# Patient Record
Sex: Male | Born: 1970 | Race: Black or African American | Hispanic: No | Smoking: Current every day smoker
Health system: Southern US, Community
[De-identification: ages and names within clinical notes are randomized; demographics above are authoritative.]

## PROBLEM LIST (undated history)

## (undated) DIAGNOSIS — I10 Essential (primary) hypertension: Secondary | ICD-10-CM

## (undated) DIAGNOSIS — K859 Acute pancreatitis without necrosis or infection, unspecified: Secondary | ICD-10-CM

## (undated) HISTORY — PX: LEG SURGERY: SHX1003

## (undated) HISTORY — PX: OTHER SURGICAL HISTORY: SHX169

---

## 2014-04-30 ENCOUNTER — Ambulatory Visit: Payer: Self-pay

## 2014-04-30 LAB — BASIC METABOLIC PANEL
Anion Gap: 7 (ref 7–16)
BUN: 12 mg/dL (ref 7–18)
Calcium, Total: 8.4 mg/dL — ABNORMAL LOW (ref 8.5–10.1)
Chloride: 106 mmol/L (ref 98–107)
Co2: 27 mmol/L (ref 21–32)
Creatinine: 0.95 mg/dL (ref 0.60–1.30)
EGFR (Non-African Amer.): >60
Glucose: 88 mg/dL (ref 65–99)
Osmolality: 279 (ref 275–301)
POTASSIUM: 3.8 mmol/L (ref 3.5–5.1)
SODIUM: 140 mmol/L (ref 136–145)

## 2014-06-17 ENCOUNTER — Encounter (HOSPITAL_COMMUNITY): Payer: Self-pay

## 2014-06-17 ENCOUNTER — Emergency Department (HOSPITAL_COMMUNITY)
Admission: EM | Admit: 2014-06-17 | Discharge: 2014-06-17 | Disposition: A | Discharge status: Home / Self Care | Payer: Medicaid Other | Attending: Emergency Medicine | Admitting: Emergency Medicine

## 2014-06-17 DIAGNOSIS — K852 Alcohol induced acute pancreatitis without necrosis or infection: Secondary | ICD-10-CM

## 2014-06-17 DIAGNOSIS — F1410 Cocaine abuse, uncomplicated: Secondary | ICD-10-CM | POA: Diagnosis not present

## 2014-06-17 DIAGNOSIS — Z720 Tobacco use: Secondary | ICD-10-CM | POA: Insufficient documentation

## 2014-06-17 DIAGNOSIS — I10 Essential (primary) hypertension: Secondary | ICD-10-CM | POA: Diagnosis not present

## 2014-06-17 DIAGNOSIS — F1210 Cannabis abuse, uncomplicated: Secondary | ICD-10-CM | POA: Diagnosis not present

## 2014-06-17 DIAGNOSIS — R109 Unspecified abdominal pain: Secondary | ICD-10-CM | POA: Diagnosis present

## 2014-06-17 HISTORY — DX: Essential (primary) hypertension: I10

## 2014-06-17 LAB — CBC WITH DIFFERENTIAL/PLATELET
Basophils Absolute: 0 10*3/uL (ref 0.0–0.1)
Basophils Relative: 0 % (ref 0–1)
Eosinophils Absolute: 0.1 10*3/uL (ref 0.0–0.7)
Eosinophils Relative: 1 % (ref 0–5)
HCT: 49.3 % (ref 39.0–52.0)
HEMOGLOBIN: 16.4 g/dL (ref 13.0–17.0)
LYMPHS ABS: 1.2 10*3/uL (ref 0.7–4.0)
LYMPHS PCT: 14 % (ref 12–46)
MCH: 27.6 pg (ref 26.0–34.0)
MCHC: 33.3 g/dL (ref 30.0–36.0)
MCV: 82.9 fL (ref 78.0–100.0)
MONO ABS: 1.1 10*3/uL — AB (ref 0.1–1.0)
Monocytes Relative: 13 % — ABNORMAL HIGH (ref 3–12)
NEUTROS ABS: 6.1 10*3/uL (ref 1.7–7.7)
NEUTROS PCT: 72 % (ref 43–77)
Platelets: 284 10*3/uL (ref 150–400)
RBC: 5.95 MIL/uL — ABNORMAL HIGH (ref 4.22–5.81)
RDW: 14.6 % (ref 11.5–15.5)
WBC: 8.5 10*3/uL (ref 4.0–10.5)

## 2014-06-17 LAB — RAPID URINE DRUG SCREEN, HOSP PERFORMED
Amphetamines: NOT DETECTED
BARBITURATES: NOT DETECTED
Benzodiazepines: NOT DETECTED
COCAINE: POSITIVE — AB
Opiates: NOT DETECTED
TETRAHYDROCANNABINOL: POSITIVE — AB

## 2014-06-17 LAB — HEPATIC FUNCTION PANEL
ALK PHOS: 77 U/L (ref 39–117)
ALT: 48 U/L (ref 0–53)
AST: 54 U/L — ABNORMAL HIGH (ref 0–37)
Albumin: 4.5 g/dL (ref 3.5–5.2)
BILIRUBIN DIRECT: 0.3 mg/dL (ref 0.0–0.5)
BILIRUBIN INDIRECT: 0.6 mg/dL (ref 0.3–0.9)
Total Bilirubin: 0.9 mg/dL (ref 0.3–1.2)
Total Protein: 7.9 g/dL (ref 6.0–8.3)

## 2014-06-17 LAB — BASIC METABOLIC PANEL
Anion gap: 9 (ref 5–15)
BUN: 8 mg/dL (ref 6–23)
CALCIUM: 9.5 mg/dL (ref 8.4–10.5)
CO2: 24 mmol/L (ref 19–32)
Chloride: 102 mmol/L (ref 96–112)
Creatinine, Ser: 0.83 mg/dL (ref 0.50–1.35)
GFR calc Af Amer: >90 mL/min (ref >90)
Glucose, Bld: 89 mg/dL (ref 70–99)
POTASSIUM: 3.6 mmol/L (ref 3.5–5.1)
Sodium: 135 mmol/L (ref 135–145)

## 2014-06-17 LAB — URINALYSIS, ROUTINE W REFLEX MICROSCOPIC
Bilirubin Urine: NEGATIVE
Glucose, UA: NEGATIVE mg/dL
Hgb urine dipstick: NEGATIVE
Ketones, ur: 40 mg/dL — AB
Leukocytes, UA: NEGATIVE
Nitrite: NEGATIVE
PROTEIN: NEGATIVE mg/dL
UROBILINOGEN UA: 0.2 mg/dL (ref 0.0–1.0)
pH: 5.5 (ref 5.0–8.0)

## 2014-06-17 LAB — ETHANOL: ALCOHOL ETHYL (B): 6 mg/dL (ref 0–9)

## 2014-06-17 LAB — LIPASE, BLOOD: LIPASE: 189 U/L — AB (ref 11–59)

## 2014-06-17 MED ORDER — SODIUM CHLORIDE 0.9 % IV BOLUS (SEPSIS)
1000.0000 mL | Freq: Once | INTRAVENOUS | Status: AC
  Administered 2014-06-17: 1000 mL via INTRAVENOUS

## 2014-06-17 MED ORDER — ONDANSETRON 8 MG PO TBDP: 8.0000 mg | ORAL_TABLET | Freq: Three times a day (TID) | ORAL | Status: DC | PRN

## 2014-06-17 MED ORDER — ONDANSETRON HCL 4 MG/2ML IJ SOLN
4.0000 mg | Freq: Once | INTRAMUSCULAR | Status: AC
  Administered 2014-06-17: 4 mg via INTRAVENOUS
  Filled 2014-06-17: qty 2

## 2014-06-17 MED ORDER — HYDROCODONE-ACETAMINOPHEN 5-325 MG PO TABS: 1.0000 | ORAL_TABLET | ORAL | Status: DC | PRN

## 2014-06-17 MED ORDER — FENTANYL CITRATE 0.05 MG/ML IJ SOLN
100.0000 ug | INTRAMUSCULAR | Status: DC | PRN
  Administered 2014-06-17: 100 ug via INTRAVENOUS
  Filled 2014-06-17: qty 2

## 2014-06-17 NOTE — ED Provider Notes (Signed)
CSN: [id number]     Arrival date & time 06/17/14  1025 History  This chart was scribe for Wesley Melter, MD by Wesley Sexton, ED Scribe. The patient was seen in room APA03/APA03 and the patient's care was started at 12:24 PM.     Chief Complaint  Patient presents with  . Emesis  . Abdominal Pain   The history is provided by the patient. No language interpreter was used.   HPI Comments: Wesley Sexton is a 44 y.o. male with a hx of HTN who presents to the Emergency Department complaining of an 8/10 constant gradually worsening abdominal pain onset yesterday. He reports associated N/V. He denies diarrhea. He denies eating this morning. He reports a similar past hx about 4-5 years ago. He states that he smokes blunts and cigarettes and drinks about 2 40 s a day. He adds that he put crack on his blunts last night. He states that he is currently on Lisinopril for his HTN.   Dr. Alexia Sexton (Health Department)  Past Medical History  Diagnosis Date  . Hypertension    Past Surgical History  Procedure Laterality Date  . "pin hole" in heart    . Leg surgery     No family history on file. History  Substance Use Topics  . Smoking status: Current Every Day Smoker  . Smokeless tobacco: Not on file  . Alcohol Use: Yes     Comment: occ- last drank 2 40oz beers last night.    Review of Systems  Gastrointestinal: Positive for nausea, vomiting and abdominal pain. Negative for diarrhea.  All other systems reviewed and are negative.     Allergies  Review of patient's allergies indicates no known allergies.  Home Medications   Prior to Admission medications   Medication Sig Start Date End Date Taking? Authorizing Provider  HYDROcodone-acetaminophen (NORCO) 5-325 MG per tablet Take 1 tablet by mouth every 4 (four) hours as needed. 06/17/14   Wesley Melter, MD  ondansetron (ZOFRAN ODT) 8 MG disintegrating tablet Take 1 tablet (8 mg total) by mouth every 8 (eight) hours as needed for nausea or  vomiting. 06/17/14   Wesley Melter, MD   BP 147/72 mmHg  Pulse 71  Temp(Src) 98 F (36.7 C) (Oral)  Resp 16  Ht  (1.727 m)  Wt 206 lb (93.441 kg)  BMI 31.33 kg/m2  SpO2 96% Physical Exam  Constitutional: He is oriented to person, place, and time. He appears well-developed and well-nourished.  HENT:  Head: Normocephalic and atraumatic.  Right Ear: External ear normal.  Left Ear: External ear normal.  Eyes: Conjunctivae and EOM are normal. Pupils are equal, round, and reactive to light.  Neck: Normal range of motion and phonation normal. Neck supple.  Cardiovascular: Normal rate, regular rhythm and normal heart sounds.   Pulmonary/Chest: Effort normal and breath sounds normal. He exhibits no bony tenderness.  Abdominal: Soft. There is no tenderness.  Genitourinary:  No CVA tenderness  Musculoskeletal: Normal range of motion.  Neurological: He is alert and oriented to person, place, and time. No cranial nerve deficit or sensory deficit. He exhibits normal muscle tone. Coordination normal.  Skin: Skin is warm, dry and intact.  Psychiatric: He has a normal mood and affect. His behavior is normal. Judgment and thought content normal.  Nursing note and vitals reviewed.   ED Course  Procedures (including critical care time) DIAGNOSTIC STUDIES: Oxygen Saturation is 99% on RA, normal by my interpretation.    COORDINATION  OF CARE: Medications  fentaNYL (SUBLIMAZE) injection 100 mcg (100 mcg Intravenous Given 06/17/14 1243)  ondansetron (ZOFRAN) injection 4 mg (4 mg Intravenous Given 06/17/14 1243)  sodium chloride 0.9 % bolus 1,000 mL (0 mLs Intravenous Stopped 06/17/14 1358)    Patient Vitals for the past 24 hrs:  BP Temp Temp src Pulse Resp SpO2 Height Weight  06/17/14 1300 147/72 mmHg - - 71 16 96 % - -  06/17/14 1200 146/99 mmHg - - 64 - 99 % - -  06/17/14 1026 (!) 163/104 mmHg 98 F (36.7 C) Oral 63 18 96 % 5\' 8"  (1.727 m) 206 lb (93.441 kg)    12:30 PM- Pt advised of plan  for treatment and pt agrees.   2:12 PM Reevaluation with update and discussion. After initial assessment and treatment, an updated evaluation reveals he is feeling better now, and would like to try drinking some fluids. Findings discussed with patient and family members.Wesley Sexton. Wesley Sexton      Labs Review Labs Reviewed  CBC WITH DIFFERENTIAL/PLATELET - Abnormal; Notable for the following:    RBC 5.95 (*)    Monocytes Relative 13 (*)    Monocytes Absolute 1.1 (*)    All other components within normal limits  URINALYSIS, ROUTINE W REFLEX MICROSCOPIC - Abnormal; Notable for the following:    Specific Gravity, Urine >1.030 (*)    Ketones, ur 40 (*)    All other components within normal limits  HEPATIC FUNCTION PANEL - Abnormal; Notable for the following:    AST 54 (*)    All other components within normal limits  LIPASE, BLOOD - Abnormal; Notable for the following:    Lipase 189 (*)    All other components within normal limits  URINE RAPID DRUG SCREEN (HOSP PERFORMED) - Abnormal; Notable for the following:    Cocaine POSITIVE (*)    Tetrahydrocannabinol POSITIVE (*)    All other components within normal limits  BASIC METABOLIC PANEL  ETHANOL    Imaging Review No results found.   EKG Interpretation None      MDM   Final diagnoses:  Alcohol-induced acute pancreatitis     Acute pancreatitis, likely mostly related to tobacco abuse. Also, tobacco abuse and cocaine abuse is present. Toxic metabolic instability in improvement with treatment in the emergency department. He is stable for discharge as an outpatient to manage his pancreatitis.  Nursing Notes Reviewed/ Care Coordinated Applicable Imaging Reviewed Interpretation of Laboratory Data incorporated into ED treatment  The patient appears reasonably screened and/or stabilized for discharge and I doubt any other medical condition or other [facility] requiring further screening, evaluation, or treatment in the ED at this time prior  to discharge.  Plan: Home Medications- Norco, Zofran; Home Treatments- rest; return here if the recommended treatment, does not improve the symptoms; Recommended follow up- PCP prn    I personally performed the services described in this documentation, which was scribed in my presence. The recorded information has been reviewed and is accurate.       Wesley MelterElliott Sexton Crew Goren, MD 06/17/14 [phone]

## 2014-06-17 NOTE — ED Notes (Signed)
Pt c/o n/v and abd pain since yesterday.  Denies diarrhea.  LBM was last night and was normal per pt.

## 2014-06-17 NOTE — Discharge Instructions (Signed)
Avoid toxins such as tobacco, alcohol and illegal substances, to improve your chances of recovering from the pancreatitis. Get plenty of rest, drink a lot of fluids such as water. Gradually began a balanced diet as soon as you can.    Acute Pancreatitis Acute pancreatitis is a disease in which the pancreas becomes suddenly inflamed. The pancreas is a large gland located behind your stomach. The pancreas produces enzymes that help digest food. The pancreas also releases the hormones glucagon and insulin that help regulate blood sugar. Damage to the pancreas occurs when the digestive enzymes from the pancreas are activated and begin attacking the pancreas before being released into the intestine. Most acute attacks last a couple of days and can cause serious complications. Some people become dehydrated and develop low blood pressure. In severe cases, bleeding into the pancreas can lead to shock and can be life-threatening. The lungs, heart, and kidneys may fail. CAUSES  Pancreatitis can happen to anyone. In some cases, the cause is unknown. Most cases are caused by:  Alcohol abuse.  Gallstones. Other less common causes are:  Certain medicines.  Exposure to certain chemicals.  Infection.  Damage caused by an accident (trauma).  Abdominal surgery. SYMPTOMS   Pain in the upper abdomen that may radiate to the back.  Tenderness and swelling of the abdomen.  Nausea and vomiting. DIAGNOSIS  Your caregiver will perform a physical exam. Blood and stool tests may be done to confirm the diagnosis. Imaging tests may also be done, such as X-rays, CT scans, or an ultrasound of the abdomen. TREATMENT  Treatment usually requires a stay in the hospital. Treatment may include:  Pain medicine.  Fluid replacement through an intravenous line (IV).  Placing a tube in the stomach to remove stomach contents and control vomiting.  Not eating for 3 or 4 days. This gives your pancreas a rest, because  enzymes are not being produced that can cause further damage.  Antibiotic medicines if your condition is caused by an infection.  Surgery of the pancreas or gallbladder. HOME CARE INSTRUCTIONS   Follow the diet advised by your caregiver. This may involve avoiding alcohol and decreasing the amount of fat in your diet.  Eat smaller, more frequent meals. This reduces the amount of digestive juices the pancreas produces.  Drink enough fluids to keep your urine clear or pale yellow.  Only take over-the-counter or prescription medicines as directed by your caregiver.  Avoid drinking alcohol if it caused your condition.  Do not smoke.  Get plenty of rest.  Check your blood sugar at home as directed by your caregiver.  Keep all follow-up appointments as directed by your caregiver. SEEK MEDICAL CARE IF:   You do not recover as quickly as expected.  You develop new or worsening symptoms.  You have persistent pain, weakness, or nausea.  You recover and then have another episode of pain. SEEK IMMEDIATE MEDICAL CARE IF:   You are unable to eat or keep fluids down.  Your pain becomes severe.  You have a fever or persistent symptoms for more than 2 to 3 days.  You have a fever and your symptoms suddenly get worse.  Your skin or the white part of your eyes turn yellow (jaundice).  You develop vomiting.  You feel dizzy, or you faint.  Your blood sugar is high (over 300 mg/dL). MAKE SURE YOU:   Understand these instructions.  Will watch your condition.  Will get help right away if you are not doing  well or get worse. Document Released: 04/04/2005 Document Revised: 10/04/2011 Document Reviewed: 07/14/2011 [facility] Patient Information 2015 [place] [place]. This information is not intended to replace advice given to you by your health care provider. Make sure you discuss any questions you have with your health care provider.

## 2014-06-29 ENCOUNTER — Encounter (HOSPITAL_COMMUNITY): Payer: Self-pay | Admitting: Emergency Medicine

## 2014-06-29 ENCOUNTER — Emergency Department (HOSPITAL_COMMUNITY)
Admission: EM | Admit: 2014-06-29 | Discharge: 2014-06-30 | Disposition: A | Discharge status: Home / Self Care | Payer: Medicaid Other | Attending: Emergency Medicine | Admitting: Emergency Medicine

## 2014-06-29 DIAGNOSIS — Z9889 Other specified postprocedural states: Secondary | ICD-10-CM | POA: Diagnosis not present

## 2014-06-29 DIAGNOSIS — I10 Essential (primary) hypertension: Secondary | ICD-10-CM | POA: Insufficient documentation

## 2014-06-29 DIAGNOSIS — M25561 Pain in right knee: Secondary | ICD-10-CM | POA: Diagnosis present

## 2014-06-29 DIAGNOSIS — Z720 Tobacco use: Secondary | ICD-10-CM | POA: Insufficient documentation

## 2014-06-29 DIAGNOSIS — M2550 Pain in unspecified joint: Secondary | ICD-10-CM

## 2014-06-29 DIAGNOSIS — M25562 Pain in left knee: Secondary | ICD-10-CM | POA: Diagnosis not present

## 2014-06-29 DIAGNOSIS — M25571 Pain in right ankle and joints of right foot: Secondary | ICD-10-CM | POA: Diagnosis not present

## 2014-06-29 DIAGNOSIS — M25572 Pain in left ankle and joints of left foot: Secondary | ICD-10-CM | POA: Diagnosis not present

## 2014-06-29 MED ORDER — KETOROLAC TROMETHAMINE 60 MG/2ML IM SOLN
60.0000 mg | Freq: Once | INTRAMUSCULAR | Status: AC
  Administered 2014-06-30: 60 mg via INTRAMUSCULAR
  Filled 2014-06-29: qty 2

## 2014-06-29 NOTE — ED Notes (Signed)
Pt c/o bilateral knee pain and left foot pain. Denies any injury.

## 2014-06-30 ENCOUNTER — Emergency Department (HOSPITAL_COMMUNITY): Payer: Medicaid Other

## 2014-06-30 MED ORDER — NAPROXEN 500 MG PO TABS: 500.0000 mg | ORAL_TABLET | Freq: Two times a day (BID) | ORAL | Status: DC

## 2014-06-30 NOTE — ED Provider Notes (Signed)
CSN: [id number]     Arrival date & time 06/29/14  2137 History   First MD Initiated Contact with Patient 06/29/14 2335     Chief Complaint  Patient presents with  . Knee Pain  . Foot Pain     (Consider location/radiation/quality/duration/timing/severity/associated sxs/prior Treatment) The history is provided by the patient.   [name] is a 44 y.o. male  With a history of HTN and polysubstance abuse presenting with a one day history of bilateral knee pain and left dorsal foot pain.  He describes aching pain which is worsened with weight bearing and walking and resolves at rest.  He denies injury or falls, denies overuse.  He is not currently employed and has not had any increased physical activity recently.  He has taken no medicines prior to arrival for his symptoms.  Denies fevers, chills, rash, leg or foot swelling and denies personal or family history of gout or arthritis.     Past Medical History  Diagnosis Date  . Hypertension    Past Surgical History  Procedure Laterality Date  . "pin hole" in heart    . Leg surgery     No family history on file. History  Substance Use Topics  . Smoking status: Current Every Day Smoker    Types: Cigarettes  . Smokeless tobacco: Not on file  . Alcohol Use: Yes     Comment: occ- last drank 2 40oz beers last night.    Review of Systems  Constitutional: Negative for fever.  Musculoskeletal: Positive for arthralgias. Negative for myalgias and joint swelling.  Skin: Negative for color change.  Neurological: Negative for weakness and numbness.      Allergies  Review of patient's allergies indicates no known allergies.  Home Medications   Prior to Admission medications   Medication Sig Start Date End Date Taking? Authorizing Provider  HYDROcodone-acetaminophen (NORCO) 5-325 MG per tablet Take 1 tablet by mouth every 4 (four) hours as needed. 06/17/14   Mancel BaleElliott Wentz, MD  naproxen (NAPROSYN) 500 MG tablet Take 1 tablet (500 mg  total) by mouth 2 (two) times daily. 06/30/14   Burgess AmorJulie Kolbe Del[place]aco, PA-C  ondansetron (ZOFRAN ODT) 8 MG disintegrating tablet Take 1 tablet (8 mg total) by mouth every 8 (eight) hours as needed for nausea or vomiting. 06/17/14   Mancel BaleElliott Wentz, MD   BP 144/82 mmHg  Pulse 84  Temp(Src) 98.4 F (36.9 C) (Oral)  Resp 16  Ht 5\' 8"  (1.727 m)  Wt 206 lb (93.441 kg)  BMI 31.33 kg/m2  SpO2 100% Physical Exam  Constitutional: He appears well-developed and well-nourished.  HENT:  Head: Atraumatic.  Neck: Normal range of motion.  Cardiovascular:  Pulses equal bilaterally  Musculoskeletal: He exhibits tenderness. He exhibits no edema.  Bilateral anterior knees are ttp, no edema, erythema, no effusion.  No ligament instability with valgus and varus strain.  Skin normal, no erythema, no rash.  Dorsalis pedal pulses full and equal.  Foot exam benign, no edema, distal cap refill intact.  No point area tenderness of either knee or the left foot.  Neurological: He is alert. He has normal strength. He displays normal reflexes. No sensory deficit.  Skin: Skin is warm and dry.  Psychiatric: He has a normal mood and affect.    ED Course  Procedures (including critical care time) Labs Review Labs Reviewed - No data to display  Imaging Review Dg Knee Complete 4 Views Left  06/30/2014   CLINICAL DATA:  Pain and swelling for  3 days.  No trauma.  EXAM: LEFT KNEE - COMPLETE 4+ VIEW  COMPARISON:  04/30/2014  FINDINGS: There is no evidence of fracture, dislocation, or joint effusion. There is no evidence of arthropathy or other focal bone abnormality. Soft tissues are unremarkable.  IMPRESSION: Negative.   Electronically Signed   By: Ellery Plunk M.D.   On: 06/30/2014 00:49     EKG Interpretation None      MDM   Final diagnoses:  Joint pain of lower extremity    Pt with complaint of pain in multiple joints with no trauma, and no PE findings to suggest source.  He reported the left knee hurt the worst, so  this joint was imaged and normal study.  He was encouraged to apply heat tx, naproxen prescribed.  No findings to suggest gout or septic joint.  Resource guide given for assistance finding pcp. Advised f/u for any worsened or changing sx.   Burgess Amor, PA-C 07/01/14 8119  Devoria Albe, MD 07/08/14 [phone]

## 2014-06-30 NOTE — Discharge Instructions (Signed)

## 2014-06-30 NOTE — ED Notes (Signed)
Discharge instructions given, pt demonstrated teach back and verbal understanding. No concerns voiced.  

## 2017-07-06 ENCOUNTER — Encounter (HOSPITAL_COMMUNITY): Payer: Self-pay | Admitting: Emergency Medicine

## 2017-07-06 ENCOUNTER — Other Ambulatory Visit: Payer: Self-pay

## 2017-07-06 ENCOUNTER — Emergency Department (HOSPITAL_COMMUNITY): Payer: Medicaid Other

## 2017-07-06 ENCOUNTER — Emergency Department (HOSPITAL_COMMUNITY)
Admission: EM | Admit: 2017-07-06 | Discharge: 2017-07-06 | Disposition: A | Discharge status: Home / Self Care | Payer: Medicaid Other | Attending: Emergency Medicine | Admitting: Emergency Medicine

## 2017-07-06 DIAGNOSIS — F17210 Nicotine dependence, cigarettes, uncomplicated: Secondary | ICD-10-CM | POA: Insufficient documentation

## 2017-07-06 DIAGNOSIS — R1010 Upper abdominal pain, unspecified: Secondary | ICD-10-CM

## 2017-07-06 DIAGNOSIS — Z79899 Other long term (current) drug therapy: Secondary | ICD-10-CM | POA: Insufficient documentation

## 2017-07-06 DIAGNOSIS — I10 Essential (primary) hypertension: Secondary | ICD-10-CM | POA: Diagnosis not present

## 2017-07-06 DIAGNOSIS — R1013 Epigastric pain: Secondary | ICD-10-CM | POA: Insufficient documentation

## 2017-07-06 HISTORY — DX: Acute pancreatitis without necrosis or infection, unspecified: K85.90

## 2017-07-06 LAB — CBC
HEMATOCRIT: 47 % (ref 39.0–52.0)
Hemoglobin: 14.7 g/dL (ref 13.0–17.0)
MCH: 26.7 pg (ref 26.0–34.0)
MCHC: 31.3 g/dL (ref 30.0–36.0)
MCV: 85.5 fL (ref 78.0–100.0)
Platelets: 288 10*3/uL (ref 150–400)
RBC: 5.5 MIL/uL (ref 4.22–5.81)
RDW: 15.8 % — ABNORMAL HIGH (ref 11.5–15.5)
WBC: 6.2 10*3/uL (ref 4.0–10.5)

## 2017-07-06 LAB — COMPREHENSIVE METABOLIC PANEL
ALT: 34 U/L (ref 17–63)
ANION GAP: 8 (ref 5–15)
AST: 35 U/L (ref 15–41)
Albumin: 3.5 g/dL (ref 3.5–5.0)
Alkaline Phosphatase: 72 U/L (ref 38–126)
BUN: 14 mg/dL (ref 6–20)
CHLORIDE: 105 mmol/L (ref 101–111)
CO2: 24 mmol/L (ref 22–32)
Calcium: 8.4 mg/dL — ABNORMAL LOW (ref 8.9–10.3)
Creatinine, Ser: 0.86 mg/dL (ref 0.61–1.24)
Glucose, Bld: 97 mg/dL (ref 65–99)
POTASSIUM: 3.9 mmol/L (ref 3.5–5.1)
Sodium: 137 mmol/L (ref 135–145)
Total Bilirubin: 0.7 mg/dL (ref 0.3–1.2)
Total Protein: 6.6 g/dL (ref 6.5–8.1)

## 2017-07-06 LAB — LIPASE, BLOOD: LIPASE: 41 U/L (ref 11–51)

## 2017-07-06 MED ORDER — PANTOPRAZOLE SODIUM 40 MG IV SOLR
40.0000 mg | Freq: Once | INTRAVENOUS | Status: AC
  Administered 2017-07-06: 40 mg via INTRAVENOUS
  Filled 2017-07-06: qty 40

## 2017-07-06 MED ORDER — ONDANSETRON 4 MG PO TBDP: ORAL_TABLET | ORAL | 0 refills | Status: DC

## 2017-07-06 MED ORDER — IOPAMIDOL (ISOVUE-300) INJECTION 61%
100.0000 mL | Freq: Once | INTRAVENOUS | Status: AC | PRN
  Administered 2017-07-06: 100 mL via INTRAVENOUS

## 2017-07-06 MED ORDER — HYDROMORPHONE HCL 1 MG/ML IJ SOLN
1.0000 mg | Freq: Once | INTRAMUSCULAR | Status: AC
  Administered 2017-07-06: 1 mg via INTRAVENOUS
  Filled 2017-07-06: qty 1

## 2017-07-06 MED ORDER — ONDANSETRON HCL 4 MG/2ML IJ SOLN
4.0000 mg | Freq: Once | INTRAMUSCULAR | Status: AC
  Administered 2017-07-06: 4 mg via INTRAVENOUS
  Filled 2017-07-06: qty 2

## 2017-07-06 MED ORDER — TRAMADOL HCL 50 MG PO TABS: 50.0000 mg | ORAL_TABLET | Freq: Four times a day (QID) | ORAL | 0 refills | Status: DC | PRN

## 2017-07-06 MED ORDER — PANTOPRAZOLE SODIUM 20 MG PO TBEC: 20.0000 mg | DELAYED_RELEASE_TABLET | Freq: Every day | ORAL | 0 refills | Status: DC

## 2017-07-06 NOTE — ED Notes (Signed)
Pt given water to drink. 

## 2017-07-06 NOTE — Discharge Instructions (Addendum)
Follow up with dr. Darrick PennaFields in `1-2 weeks.  Get a family md

## 2017-07-06 NOTE — ED Notes (Signed)
ED Provider at bedside. 

## 2017-07-06 NOTE — ED Triage Notes (Signed)
Per EMS, pt c/o upper quadrant pain and tenderness x 4 days. Denies n/v/d. Pt recently diagnosed with pancreatitis. Pt received 250 cc bolus of NS with EMS.

## 2017-07-06 NOTE — ED Provider Notes (Signed)
[facility] Provider Note   CSN: [id number] Arrival date & time: 07/06/17  1233     History   Chief Complaint Chief Complaint  Patient presents with  . Abdominal Pain    HPI Wesley Sexton is a 47 y.o. male.  Patient complains of epigastric abdominal pain.  Patient states that he has a history of pancreatitis  The history is provided by the patient. No language interpreter was used.  Abdominal Pain   This is a recurrent problem. The current episode started yesterday. The problem occurs constantly. The problem has not changed since onset.The pain is associated with an unknown factor. The pain is located in the epigastric region. The quality of the pain is aching. The pain is at a severity of 5/10. Pertinent negatives include diarrhea, frequency, hematuria and headaches.    Past Medical History:  Diagnosis Date  . Hypertension   . Pancreatitis     There are no active problems to display for this patient.   Past Surgical History:  Procedure Laterality Date  . "pin hole" in heart    . LEG SURGERY         Home Medications    Prior to Admission medications   Medication Sig Start Date End Date Taking? Authorizing Provider  lisinopril (PRINIVIL,ZESTRIL) 5 MG tablet Take 5 mg by mouth daily.   Yes [provider]  naproxen (NAPROSYN) 500 MG tablet Take 1 tablet (500 mg total) by mouth 2 (two) times daily. 06/30/14  Yes Idol, Raynelle FanningJulie, PA-C  ondansetron (ZOFRAN ODT) 4 MG disintegrating tablet 4mg  ODT q4 hours prn nausea/vomit 07/06/17   Bethann BerkshireZammit, Kaydin Labo, MD  pantoprazole (PROTONIX) 20 MG tablet Take 1 tablet (20 mg total) by mouth daily. 07/06/17   Bethann BerkshireZammit, Samba Cumba, MD  traMADol (ULTRAM) 50 MG tablet Take 1 tablet (50 mg total) by mouth every 6 (six) hours as needed. 07/06/17   Bethann BerkshireZammit, Karyme Mcconathy, MD    Family History No family history on file.  Social History Social History   Tobacco Use  . Smoking status: Current Every Day Smoker    Types:  Cigarettes  . Smokeless tobacco: Never Used  Substance Use Topics  . Alcohol use: Yes    Comment: Drank beer last night  . Drug use: Yes    Types: Cocaine, Marijuana    Comment: Used THC last night     Allergies   Patient has no known allergies.   Review of Systems Review of Systems  Constitutional: Negative for appetite change and fatigue.  HENT: Negative for congestion, ear discharge and sinus pressure.   Eyes: Negative for discharge.  Respiratory: Negative for cough.   Cardiovascular: Negative for chest pain.  Gastrointestinal: Positive for abdominal pain. Negative for diarrhea.  Genitourinary: Negative for frequency and hematuria.  Musculoskeletal: Negative for back pain.  Skin: Negative for rash.  Neurological: Negative for seizures and headaches.  Psychiatric/Behavioral: Negative for hallucinations.     Physical Exam Updated Vital Signs BP 130/89 (BP Location: Right Arm)   Pulse 62   Temp 98 F (36.7 C) (Oral)   Resp 18   Ht 5\' 8"  (1.727 m)   Wt 93 kg (205 lb)   SpO2 100%   BMI 31.17 kg/m   Physical Exam  Constitutional: He is oriented to person, place, and time. He appears well-developed.  HENT:  Head: Normocephalic.  Eyes: Conjunctivae and EOM are normal. No scleral icterus.  Neck: Neck supple. No thyromegaly present.  Cardiovascular: Normal rate and regular rhythm. Exam  reveals no gallop and no friction rub.  No murmur heard. Pulmonary/Chest: No stridor. He has no wheezes. He has no rales. He exhibits no tenderness.  Abdominal: He exhibits no distension. There is tenderness. There is no rebound.  Musculoskeletal: Normal range of motion. He exhibits no edema.  Lymphadenopathy:    He has no cervical adenopathy.  Neurological: He is oriented to person, place, and time. He exhibits normal muscle tone. Coordination normal.  Skin: No rash noted. No erythema.  Psychiatric: He has a normal mood and affect. His behavior is normal.  Nursing note and vitals  reviewed.    ED Treatments / Results  Labs (all labs ordered are listed, but only abnormal results are displayed) Labs Reviewed  COMPREHENSIVE METABOLIC PANEL - Abnormal; Notable for the following components:      Result Value   Calcium 8.4 (*)    All other components within normal limits  CBC - Abnormal; Notable for the following components:   RDW 15.8 (*)    All other components within normal limits  LIPASE, BLOOD  URINALYSIS, ROUTINE W REFLEX MICROSCOPIC    EKG  EKG Interpretation None       Radiology Ct Abdomen Pelvis W Contrast  Result Date: 07/06/2017 CLINICAL DATA:  Abdominal distension. EXAM: CT ABDOMEN AND PELVIS WITH CONTRAST TECHNIQUE: Multidetector CT imaging of the abdomen and pelvis was performed using the standard protocol following bolus administration of intravenous contrast. CONTRAST:  ISOVUE-300 IOPAMIDOL (ISOVUE-300) INJECTION 61% COMPARISON:  None. FINDINGS: Lower chest: No acute abnormality. Hepatobiliary: No focal liver abnormality is seen. No gallstones, gallbladder wall thickening, or biliary dilatation. Pancreas: Unremarkable. No pancreatic ductal dilatation or surrounding inflammatory changes. Spleen: Normal in size without focal abnormality. Adrenals/Urinary Tract: Normal adrenal glands. Normal appearance of the right kidney. Indeterminate 8 mm circumscribed mass in the lower pole of the left kidney, too small to be actually characterized by CT. No nephrolithiasis or hydronephrosis. Normal appearance of the urinary bladder. Stomach/Bowel: Stomach is within normal limits. Appendix appears normal. No evidence of bowel wall thickening, distention, or inflammatory changes. Vascular/Lymphatic: Mild atherosclerotic disease of the iliac arteries. No enlarged abdominal or pelvic lymph nodes. Reproductive: Prostate is unremarkable. Other: No abdominal wall hernia or abnormality. No abdominopelvic ascites. Musculoskeletal: No acute or significant osseous findings.  IMPRESSION: No acute abnormalities within the abdomen or pelvis. 8 mm too small to be actually characterize left lower pole renal mass. Follow-up may be considered with a renal ultrasound. 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 Electronically Signed   By: Ted Mcalpine M.D.   On: 07/06/2017 14:00    Procedures Procedures (including critical care time)  Medications Ordered in ED Medications  HYDROmorphone (DILAUDID) injection 1 mg (1 mg Intravenous Given 07/06/17 1304)  ondansetron (ZOFRAN) injection 4 mg (4 mg Intravenous Given 07/06/17 1304)  pantoprazole (PROTONIX) injection 40 mg (40 mg Intravenous Given 07/06/17 1304)  iopamidol (ISOVUE-300) 61 % injection 100 mL (100 mLs Intravenous Contrast Given 07/06/17 1335)     Initial Impression / Assessment and Plan / ED Course  I have reviewed the triage vital signs and the nursing notes.  Pertinent labs & imaging results that were available during my care of the patient were reviewed by me and considered in my medical decision making (see chart for details).     Labs unremarkable, including CBC chemistries and lipase.  CT scan of abdomen shows no acute changes.  Patient will be placed on protonic and referred to GI  Final Clinical Impressions(s) /  ED Diagnoses   Final diagnoses:  Pain of upper abdomen    ED Discharge Orders        Ordered    pantoprazole (PROTONIX) 20 MG tablet  Daily     07/06/17 1440    traMADol (ULTRAM) 50 MG tablet  Every 6 hours PRN     07/06/17 1440    ondansetron (ZOFRAN ODT) 4 MG disintegrating tablet     07/06/17 1440       Bethann Berkshire, MD 07/06/17 1443

## 2018-04-11 ENCOUNTER — Emergency Department (HOSPITAL_COMMUNITY): Payer: Medicaid Other

## 2018-04-11 ENCOUNTER — Emergency Department (HOSPITAL_COMMUNITY)
Admission: EM | Admit: 2018-04-11 | Discharge: 2018-04-11 | Disposition: A | Discharge status: Home / Self Care | Payer: Medicaid Other | Attending: Emergency Medicine | Admitting: Emergency Medicine

## 2018-04-11 ENCOUNTER — Other Ambulatory Visit: Payer: Self-pay

## 2018-04-11 ENCOUNTER — Encounter (HOSPITAL_COMMUNITY): Payer: Self-pay

## 2018-04-11 DIAGNOSIS — M25562 Pain in left knee: Secondary | ICD-10-CM | POA: Diagnosis present

## 2018-04-11 DIAGNOSIS — F17210 Nicotine dependence, cigarettes, uncomplicated: Secondary | ICD-10-CM | POA: Insufficient documentation

## 2018-04-11 DIAGNOSIS — I10 Essential (primary) hypertension: Secondary | ICD-10-CM | POA: Insufficient documentation

## 2018-04-11 MED ORDER — IBUPROFEN 400 MG PO TABS
600.0000 mg | ORAL_TABLET | Freq: Once | ORAL | Status: AC
  Administered 2018-04-11: 600 mg via ORAL
  Filled 2018-04-11: qty 2

## 2018-04-11 NOTE — ED Provider Notes (Signed)
[facility] EMERGENCY DEPARTMENT Provider Note   CSN: [id number] Arrival date & time: 04/11/18  0014  Time seen 12:22 AM   History   Chief Complaint Chief Complaint  Patient presents with  . Knee Injury    HPI Wesley Sexton is a [age] y.o. male.  HPI patient states around 2 PM on December 24 he and his cousin were wrestling and playing and he spun around to run away and heard a pop in his left knee.  He complains of pain on the medial aspect of his left knee.  He states he can walk on it but he started using a cane.  He states he is never had an injury to the knee before other than a chainsaw injury requiring staples.  Patient presents via EMS.  Patient noted to have blood pressure medication in his med list.  He states he ran out years ago and he no longer takes it.  We discussed the importance of taking blood pressure medication.  PCP Patient, No Pcp Per   Past Medical History:  Diagnosis Date  . Hypertension   . Pancreatitis     There are no active problems to display for this patient.   Past Surgical History:  Procedure Laterality Date  . "pin hole" in heart    . LEG SURGERY          Home Medications    Prior to Admission medications   Medication Sig Start Date End Date Taking? Authorizing Provider  lisinopril (PRINIVIL,ZESTRIL) 5 MG tablet Take 5 mg by mouth daily.    [provider]  naproxen (NAPROSYN) 500 MG tablet Take 1 tablet (500 mg total) by mouth 2 (two) times daily. 06/30/14   Burgess AmorIdol, Julie, PA-C  ondansetron (ZOFRAN ODT) 4 MG disintegrating tablet 4mg  ODT q4 hours prn nausea/vomit 07/06/17   Bethann BerkshireZammit, Joseph, MD  pantoprazole (PROTONIX) 20 MG tablet Take 1 tablet (20 mg total) by mouth daily. 07/06/17   Bethann BerkshireZammit, Joseph, MD  traMADol (ULTRAM) 50 MG tablet Take 1 tablet (50 mg total) by mouth every 6 (six) hours as needed. 07/06/17   Bethann BerkshireZammit, Joseph, MD    Family History No family history on file.  Social History Social History   Tobacco Use  .  Smoking status: Current Every Day Smoker    Types: Cigarettes  . Smokeless tobacco: Never Used  Substance Use Topics  . Alcohol use: Yes    Comment: Drank beer last night  . Drug use: Yes    Types: Cocaine, Marijuana    Comment: Used THC last night  smokes 4-5 cigarettes a day Drinks 80 ounces of beer daily, including today On disability for learning disabililty   Allergies   Patient has no known allergies.   Review of Systems Review of Systems  All other systems reviewed and are negative.    Physical Exam Updated Vital Signs BP (!) 151/100 (BP Location: Left Arm)   Pulse 77   Temp 98.2 F (36.8 C) (Oral)   Resp 19   Ht 5\' 8"  (1.727 m)   Wt 95.3 kg   SpO2 97%   BMI 31.93 kg/m   Vital signs normal except hypertension   Physical Exam Vitals signs and nursing note reviewed.  Constitutional:      General: He is not in acute distress.    Appearance: Normal appearance. He is normal weight.  HENT:     Head: Normocephalic and atraumatic.     Right Ear: External ear normal.  Left Ear: External ear normal.     Nose: Nose normal.  Eyes:     Extraocular Movements: Extraocular movements intact.     Conjunctiva/sclera: Conjunctivae normal.  Neck:     Musculoskeletal: Normal range of motion.  Cardiovascular:     Rate and Rhythm: Normal rate.  Pulmonary:     Effort: Pulmonary effort is normal. No respiratory distress.  Musculoskeletal:        General: No swelling or deformity.     Right lower leg: No edema.     Left lower leg: No edema.     Comments: Patient has no joint effusion.  He is tender to palpation over the left medial joint space that reproduces his complaints of pain.  Skin:    General: Skin is warm and dry.     Findings: No erythema or rash.  Neurological:     General: No focal deficit present.     Mental Status: He is alert and oriented to person, place, and time.  Psychiatric:        Mood and Affect: Mood normal.        Behavior: Behavior  normal.        Thought Content: Thought content normal.      ED Treatments / Results  Labs (all labs ordered are listed, but only abnormal results are displayed) Labs Reviewed - No data to display  EKG None  Radiology Dg Knee Complete 4 Views Left  Result Date: 04/11/2018 CLINICAL DATA:  Twisting injury yesterday with pain, initial encounter EXAM: LEFT KNEE - COMPLETE 4+ VIEW COMPARISON:  06/30/2014 FINDINGS: No evidence of fracture, dislocation, or joint effusion. No evidence of arthropathy or other focal bone abnormality. Soft tissues are unremarkable. IMPRESSION: No acute abnormality noted. Electronically Signed   By: Alcide CleverMark  Lukens M.D.   On: 04/11/2018 00:59    Procedures Procedures (including critical care time)  Medications Ordered in ED Medications  ibuprofen (ADVIL,MOTRIN) tablet 600 mg (600 mg Oral Given 04/11/18 0045)     Initial Impression / Assessment and Plan / ED Course  I have reviewed the triage vital signs and the nursing notes.  Pertinent labs & imaging results that were available during my care of the patient were reviewed by me and considered in my medical decision making (see chart for details).     X-ray was ordered of his knee.  He was given Motrin for pain.  12:55 AM I reviewed his x-ray and I do not see anything acute present.  Knee immobilizer was ordered.  Still waiting for official radiologist reading.  1:03 AM his x-ray is resulted and is without acute changes.  Patient was discharged home.  He should wear the knee immobilizer for the next week, if he still painful at that time he should be evaluated by the orthopedist on call.  He can take Motrin and Tylenol for pain and use ice packs for comfort.  Final Clinical Impressions(s) / ED Diagnoses   Final diagnoses:  Acute pain of left knee  Essential hypertension    ED Discharge Orders    None    OTC ibuprofen and acetaminophen  Plan discharge  Devoria AlbeIva Dayson Aboud, MD, Concha PyoFACEP    Mikeya Tomasetti,  MD 04/11/18 [id number]

## 2018-04-11 NOTE — ED Triage Notes (Signed)
Pt twisted his left knee while wrestling with a family member this afternoon.  Mild swelling noted to medial aspect.  Per ems, pt was able to bear weight

## 2018-04-11 NOTE — ED Notes (Signed)
Knee immobilizer placed on left leg

## 2018-04-11 NOTE — Discharge Instructions (Signed)
You need to get a primary care doctor to manage your blood pressure. Use ice on your painful knee. Take ibuprofen 600 mg with acetaminophen 650 mg every 6 hrs as needed for pain. Wear the knee immobilizer for comfort. Have Dr Romeo AppleHarrison, the [place]oped[place] in [place], recheck your knee if still painful in the next 7-10 days.

## 2019-01-10 ENCOUNTER — Encounter (HOSPITAL_COMMUNITY): Payer: Self-pay

## 2019-01-10 DIAGNOSIS — N5089 Other specified disorders of the male genital organs: Secondary | ICD-10-CM | POA: Diagnosis present

## 2019-01-10 DIAGNOSIS — Z79899 Other long term (current) drug therapy: Secondary | ICD-10-CM | POA: Insufficient documentation

## 2019-01-10 DIAGNOSIS — I10 Essential (primary) hypertension: Secondary | ICD-10-CM | POA: Diagnosis not present

## 2019-01-10 DIAGNOSIS — N485 Ulcer of penis: Secondary | ICD-10-CM | POA: Diagnosis not present

## 2019-01-10 DIAGNOSIS — F17210 Nicotine dependence, cigarettes, uncomplicated: Secondary | ICD-10-CM | POA: Diagnosis not present

## 2019-01-10 NOTE — ED Triage Notes (Signed)
Pt states swelling in left groin x 4 days, states he thinks is from an insect bite, no drainage currently

## 2019-01-11 ENCOUNTER — Emergency Department (HOSPITAL_COMMUNITY)
Admission: EM | Admit: 2019-01-11 | Discharge: 2019-01-11 | Disposition: A | Discharge status: Home / Self Care | Payer: Medicaid Other | Attending: Emergency Medicine | Admitting: Emergency Medicine

## 2019-01-11 DIAGNOSIS — N485 Ulcer of penis: Secondary | ICD-10-CM

## 2019-01-11 LAB — URINALYSIS, ROUTINE W REFLEX MICROSCOPIC
Bilirubin Urine: NEGATIVE
Glucose, UA: NEGATIVE mg/dL
Hgb urine dipstick: NEGATIVE
Ketones, ur: NEGATIVE mg/dL
Leukocytes,Ua: NEGATIVE
Nitrite: NEGATIVE
Protein, ur: NEGATIVE mg/dL
Specific Gravity, Urine: 1.014 (ref 1.005–1.030)
pH: 5 (ref 5.0–8.0)

## 2019-01-11 LAB — CBG MONITORING, ED: Glucose-Capillary: 80 mg/dL (ref 70–99)

## 2019-01-11 MED ORDER — DOXYCYCLINE HYCLATE 100 MG PO CAPS: 100.0000 mg | ORAL_CAPSULE | Freq: Two times a day (BID) | ORAL | 0 refills | Status: DC

## 2019-01-11 MED ORDER — PENICILLIN G BENZATHINE 1200000 UNIT/2ML IM SUSP
1.2000 10*6.[IU] | Freq: Once | INTRAMUSCULAR | Status: AC
  Administered 2019-01-11: 03:00:00 1.2 10*6.[IU] via INTRAMUSCULAR
  Filled 2019-01-11: qty 2

## 2019-01-11 NOTE — ED Provider Notes (Signed)
[facility] EMERGENCY DEPARTMENT Provider Note   CSN: [id number] Arrival date & time: 01/10/19  2311   Time seen 2:48 AM  History   Chief Complaint Chief Complaint  Patient presents with  . Groin Swelling    HPI Wesley Sexton is a 48 y.o. male.     HPI patient states about 3 days ago he had some swelling and a sore develop on the right side of his penis.  He states he is never had that before.  He states he has some mild pain and a lot of itching.  He denies any new sexual contact.  He denies any sexual contact in a foreign country.  He states when he urinates he has to strain and he has some dysuria but no frequency.  He denies any abdominal pain, nausea, vomiting, or fever.  He denies having anything like this before.  He denies a history of diabetes.  PCP [facility]   Past Medical History:  Diagnosis Date  . Hypertension   . Pancreatitis     There are no active problems to display for this patient.   Past Surgical History:  Procedure Laterality Date  . "pin hole" in heart    . LEG SURGERY          Home Medications    Prior to Admission medications   Medication Sig Start Date End Date Taking? Authorizing Provider  doxycycline (VIBRAMYCIN) 100 MG capsule Take 1 capsule (100 mg total) by mouth 2 (two) times daily. 01/11/19   Rolland Porter, MD  lisinopril (PRINIVIL,ZESTRIL) 5 MG tablet Take 5 mg by mouth daily.    [provider]  naproxen (NAPROSYN) 500 MG tablet Take 1 tablet (500 mg total) by mouth 2 (two) times daily. 06/30/14   Evalee Jefferson, PA-C  ondansetron (ZOFRAN ODT) 4 MG disintegrating tablet 4mg  ODT q4 hours prn nausea/vomit 07/06/17   Milton Ferguson, MD  pantoprazole (PROTONIX) 20 MG tablet Take 1 tablet (20 mg total) by mouth daily. 07/06/17   Milton Ferguson, MD  traMADol (ULTRAM) 50 MG tablet Take 1 tablet (50 mg total) by mouth every 6 (six) hours as needed. 07/06/17   Milton Ferguson, MD    Family History No family  history on file.  Social History Social History   Tobacco Use  . Smoking status: Current Every Day Smoker    Types: Cigarettes  . Smokeless tobacco: Never Used  Substance Use Topics  . Alcohol use: Yes    Comment: Drank beer last night  . Drug use: Yes    Types: Cocaine, Marijuana    Comment: Used THC last night     Allergies   Patient has no known allergies.   Review of Systems Review of Systems  All other systems reviewed and are negative.    Physical Exam Updated Vital Signs BP (!) 141/75 (BP Location: Right Arm)   Pulse 72   Temp 98.3 F (36.8 C) (Oral)   Resp 16   Ht 5\' 8"  (1.727 m)   Wt 83.9 kg   SpO2 100%   BMI 28.13 kg/m   Physical Exam Vitals signs and nursing note reviewed.  Constitutional:      Appearance: Normal appearance.  HENT:     Head: Normocephalic and atraumatic.  Eyes:     Extraocular Movements: Extraocular movements intact.     Conjunctiva/sclera: Conjunctivae normal.  Neck:     Musculoskeletal: Normal range of motion.  Cardiovascular:     Rate and  Rhythm: Normal rate.  Pulmonary:     Effort: Pulmonary effort is normal. No respiratory distress.  Genitourinary:    Comments: Patient has a large ulcerative lesion on the shaft of the penis just below the head on the right side.  It is about the size of a quarter.  He is uncircumcised and appears to have some mild swelling of the foreskin.  He has bilateral inguinal lymphadenopathy that is larger on the left.  The size of the largest lymph node is about the size of a dime. Skin:    General: Skin is warm and dry.  Neurological:     General: No focal deficit present.     Mental Status: He is alert and oriented to person, place, and time.     Cranial Nerves: No cranial nerve deficit.  Psychiatric:        Mood and Affect: Mood normal.        Behavior: Behavior normal.        Thought Content: Thought content normal.      ED Treatments / Results  Labs (all labs ordered are listed,  but only abnormal results are displayed) Results for orders placed or performed during the hospital encounter of 01/11/19  Urinalysis, Routine w reflex microscopic  Result Value Ref Range   Color, Urine YELLOW YELLOW   APPearance CLEAR CLEAR   Specific Gravity, Urine 1.014 1.005 - 1.030   pH 5.0 5.0 - 8.0   Glucose, UA NEGATIVE NEGATIVE mg/dL   Hgb urine dipstick NEGATIVE NEGATIVE   Bilirubin Urine NEGATIVE NEGATIVE   Ketones, ur NEGATIVE NEGATIVE mg/dL   Protein, ur NEGATIVE NEGATIVE mg/dL   Nitrite NEGATIVE NEGATIVE   Leukocytes,Ua NEGATIVE NEGATIVE  CBG monitoring, ED  Result Value Ref Range   Glucose-Capillary 80 70 - 99 mg/dL   Laboratory interpretation all normal    EKG None  Radiology No results found.  Procedures Procedures (including critical care time)  Medications Ordered in ED Medications  penicillin g benzathine (BICILLIN LA) 1200000 UNIT/2ML injection 1.2 Million Units (1.2 Million Units Intramuscular Given 01/11/19 0301)     Initial Impression / Assessment and Plan / ED Course  I have reviewed the triage vital signs and the nursing notes.  Pertinent labs & imaging results that were available during my care of the patient were reviewed by me and considered in my medical decision making (see chart for details).       Consideration was given to primary syphilis although he denies any new recent sexual contact.  RPR was done however if this is primary syphilis would be expected to be negative.  Patient is agreeable to get a HIV test.  He has not had any traveling to suggest he might have chancroid or LGV.  Patient CBG is normal so he does not have diabetes.  He was sent home on doxycycline.  Final Clinical Impressions(s) / ED Diagnoses   Final diagnoses:  Penile ulcer    ED Discharge Orders         Ordered    doxycycline (VIBRAMYCIN) 100 MG capsule  2 times daily     01/11/19 0413         Plan discharge  Devoria Albe, MD, Concha Pyo, MD 01/11/19 [phone]

## 2019-01-11 NOTE — Discharge Instructions (Addendum)
You will be called if your STD test are positive.  You should have your sexual partner go get tested for STDs.  Take the antibiotics until gone.  Recheck if you get pain, worsening swelling, or are unable to urinate.

## 2019-01-12 LAB — HIV ANTIBODY (ROUTINE TESTING W REFLEX): HIV Screen 4th Generation wRfx: NONREACTIVE

## 2019-01-14 LAB — RPR, QUANT+TP ABS (REFLEX)
Rapid Plasma Reagin, Quant: 1:32 {titer} — ABNORMAL HIGH
T Pallidum Abs: REACTIVE — AB

## 2019-01-15 LAB — RPR: RPR Ser Ql: REACTIVE — AB

## 2020-02-15 ENCOUNTER — Emergency Department (HOSPITAL_COMMUNITY)
Admission: EM | Admit: 2020-02-15 | Discharge: 2020-02-15 | Disposition: A | Discharge status: Home / Self Care | Payer: Medicaid Other | Attending: Emergency Medicine | Admitting: Emergency Medicine

## 2020-02-15 ENCOUNTER — Emergency Department (HOSPITAL_COMMUNITY): Payer: Medicaid Other

## 2020-02-15 ENCOUNTER — Encounter (HOSPITAL_COMMUNITY): Payer: Self-pay | Admitting: Emergency Medicine

## 2020-02-15 ENCOUNTER — Other Ambulatory Visit: Payer: Self-pay

## 2020-02-15 DIAGNOSIS — W228XXA Striking against or struck by other objects, initial encounter: Secondary | ICD-10-CM | POA: Diagnosis not present

## 2020-02-15 DIAGNOSIS — Z79899 Other long term (current) drug therapy: Secondary | ICD-10-CM | POA: Diagnosis not present

## 2020-02-15 DIAGNOSIS — R202 Paresthesia of skin: Secondary | ICD-10-CM | POA: Diagnosis not present

## 2020-02-15 DIAGNOSIS — I10 Essential (primary) hypertension: Secondary | ICD-10-CM | POA: Insufficient documentation

## 2020-02-15 DIAGNOSIS — F17210 Nicotine dependence, cigarettes, uncomplicated: Secondary | ICD-10-CM | POA: Insufficient documentation

## 2020-02-15 DIAGNOSIS — M79641 Pain in right hand: Secondary | ICD-10-CM

## 2020-02-15 DIAGNOSIS — R2231 Localized swelling, mass and lump, right upper limb: Secondary | ICD-10-CM | POA: Insufficient documentation

## 2020-02-15 DIAGNOSIS — Y9372 Activity, wrestling: Secondary | ICD-10-CM | POA: Diagnosis not present

## 2020-02-15 MED ORDER — IBUPROFEN 800 MG PO TABS: 800.0000 mg | ORAL_TABLET | Freq: Three times a day (TID) | ORAL | 0 refills | Status: DC

## 2020-02-15 MED ORDER — IBUPROFEN 800 MG PO TABS
800.0000 mg | ORAL_TABLET | Freq: Once | ORAL | Status: AC
  Administered 2020-02-15: 800 mg via ORAL
  Filled 2020-02-15: qty 1

## 2020-02-15 NOTE — Discharge Instructions (Addendum)
Your x-rays today of your hand and wrist do not show any bony injuries or dislocations.  Your symptoms are likely related to a sprain.  This should improve in 1 to 2 weeks.  Avoid excessive use of your right hand.  Elevate and apply ice packs on and off to help with swelling.  Use the brace for support and comfort for at least 1 week.  Follow-up with the orthopedic provider listed in 1 week if your symptoms are not improving.
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
## 2020-03-31 ENCOUNTER — Other Ambulatory Visit (HOSPITAL_COMMUNITY): Payer: Self-pay | Admitting: Internal Medicine

## 2020-03-31 ENCOUNTER — Other Ambulatory Visit: Payer: Self-pay | Admitting: Internal Medicine

## 2020-03-31 DIAGNOSIS — B182 Chronic viral hepatitis C: Secondary | ICD-10-CM

## 2020-03-31 DIAGNOSIS — K7469 Other cirrhosis of liver: Secondary | ICD-10-CM

## 2020-10-19 ENCOUNTER — Emergency Department (HOSPITAL_COMMUNITY): Payer: Medicaid Other

## 2020-10-19 ENCOUNTER — Emergency Department (HOSPITAL_COMMUNITY)
Admission: EM | Admit: 2020-10-19 | Discharge: 2020-10-19 | Disposition: A | Discharge status: Home / Self Care | Payer: Medicaid Other | Attending: Emergency Medicine | Admitting: Emergency Medicine

## 2020-10-19 ENCOUNTER — Other Ambulatory Visit: Payer: Self-pay

## 2020-10-19 ENCOUNTER — Encounter (HOSPITAL_COMMUNITY): Payer: Self-pay | Admitting: *Deleted

## 2020-10-19 DIAGNOSIS — Y92410 Unspecified street and highway as the place of occurrence of the external cause: Secondary | ICD-10-CM | POA: Diagnosis not present

## 2020-10-19 DIAGNOSIS — S3992XA Unspecified injury of lower back, initial encounter: Secondary | ICD-10-CM | POA: Diagnosis present

## 2020-10-19 DIAGNOSIS — F17210 Nicotine dependence, cigarettes, uncomplicated: Secondary | ICD-10-CM | POA: Diagnosis not present

## 2020-10-19 DIAGNOSIS — S300XXA Contusion of lower back and pelvis, initial encounter: Secondary | ICD-10-CM | POA: Insufficient documentation

## 2020-10-19 DIAGNOSIS — Z79899 Other long term (current) drug therapy: Secondary | ICD-10-CM | POA: Diagnosis not present

## 2020-10-19 DIAGNOSIS — I10 Essential (primary) hypertension: Secondary | ICD-10-CM | POA: Diagnosis not present

## 2020-10-19 DIAGNOSIS — M545 Low back pain, unspecified: Secondary | ICD-10-CM

## 2020-10-19 MED ORDER — NAPROXEN 500 MG PO TABS: 500.0000 mg | ORAL_TABLET | Freq: Two times a day (BID) | ORAL | 0 refills | Status: DC

## 2020-10-19 MED ORDER — KETOROLAC TROMETHAMINE 30 MG/ML IJ SOLN
30.0000 mg | Freq: Once | INTRAMUSCULAR | Status: AC
  Administered 2020-10-19: 30 mg via INTRAMUSCULAR
  Filled 2020-10-19: qty 1

## 2020-10-19 MED ORDER — FENTANYL CITRATE (PF) 100 MCG/2ML IJ SOLN
50.0000 ug | Freq: Once | INTRAMUSCULAR | Status: DC
  Filled 2020-10-19: qty 2

## 2020-10-19 MED ORDER — CYCLOBENZAPRINE HCL 10 MG PO TABS: 10.0000 mg | ORAL_TABLET | Freq: Two times a day (BID) | ORAL | 0 refills | Status: DC | PRN
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
## 2020-10-19 NOTE — ED Triage Notes (Signed)
Low back pain x 3-4 days, worse with movement

## 2020-10-19 NOTE — Discharge Instructions (Addendum)
You were seen today for back pain.  The x-ray done did not show any signs of a fracture or dislocated disc.  I believe your pain is likely due to musculoskeletal strain.  Please take naproxen twice daily with food and water.  This is an anti-inflammatory that will help with inflammation.  Please take the Flexeril up to 2 times daily at night.  Do not take this during the day as it impairs her ability to drive.  This may take a few weeks to fully resolve.  If conditions worsen or if they fail to improve, please follow-up with your primary care doctor in the next few weeks.  Additionally if things significantly worsen you can always return back to the ED for further evaluation.

## 2020-12-07 ENCOUNTER — Emergency Department (HOSPITAL_COMMUNITY): Payer: Medicaid Other

## 2020-12-07 ENCOUNTER — Encounter (HOSPITAL_COMMUNITY): Payer: Self-pay | Admitting: Emergency Medicine

## 2020-12-07 ENCOUNTER — Other Ambulatory Visit: Payer: Self-pay

## 2020-12-07 ENCOUNTER — Emergency Department (HOSPITAL_COMMUNITY)
Admission: EM | Admit: 2020-12-07 | Discharge: 2020-12-07 | Disposition: A | Discharge status: Home / Self Care | Payer: Medicaid Other | Attending: Student | Admitting: Student

## 2020-12-07 DIAGNOSIS — F17210 Nicotine dependence, cigarettes, uncomplicated: Secondary | ICD-10-CM | POA: Insufficient documentation

## 2020-12-07 DIAGNOSIS — M5432 Sciatica, left side: Secondary | ICD-10-CM

## 2020-12-07 DIAGNOSIS — I10 Essential (primary) hypertension: Secondary | ICD-10-CM | POA: Diagnosis not present

## 2020-12-07 DIAGNOSIS — M5442 Lumbago with sciatica, left side: Secondary | ICD-10-CM | POA: Diagnosis not present

## 2020-12-07 DIAGNOSIS — Z79899 Other long term (current) drug therapy: Secondary | ICD-10-CM | POA: Insufficient documentation

## 2020-12-07 DIAGNOSIS — M545 Low back pain, unspecified: Secondary | ICD-10-CM | POA: Diagnosis present

## 2020-12-07 MED ORDER — NAPROXEN 500 MG PO TABS: 500.0000 mg | ORAL_TABLET | Freq: Two times a day (BID) | ORAL | 0 refills | Status: DC

## 2020-12-07 MED ORDER — CYCLOBENZAPRINE HCL 10 MG PO TABS
10.0000 mg | ORAL_TABLET | Freq: Once | ORAL | Status: AC
  Administered 2020-12-07: 10 mg via ORAL
  Filled 2020-12-07: qty 1

## 2020-12-07 MED ORDER — CYCLOBENZAPRINE HCL 5 MG PO TABS: 5.0000 mg | ORAL_TABLET | Freq: Three times a day (TID) | ORAL | 0 refills | Status: DC | PRN

## 2020-12-07 MED ORDER — NAPROXEN 250 MG PO TABS
500.0000 mg | ORAL_TABLET | Freq: Once | ORAL | Status: AC
  Administered 2020-12-07: 500 mg via ORAL
  Filled 2020-12-07: qty 2

## 2020-12-07 NOTE — ED Triage Notes (Signed)
Pt brought in by [facility] from home with c/o back pain and left leg pain. 20g IV placed by EMS, but no medication given.

## 2020-12-07 NOTE — ED Notes (Signed)
Patient transported to X-ray 

## 2020-12-07 NOTE — ED Triage Notes (Signed)
Pt to the ED with c/o back and leg pain for the past 3 days.

## 2020-12-07 NOTE — Discharge Instructions (Signed)
Take the medications prescribed for your low back pain.  Avoid lifting,  Bending,  Twisting or any other activity that worsens your pain over the next week.  Apply heating pad to your site of maximum pain for 20 minutes 3 times daily.  You should get rechecked if your symptoms are not better over the next 5 days,  Or you develop increased pain,  Weakness in your leg(s) or loss of bladder or bowel function - these are symptoms can be symptoms of a worsening condition.  I understand you have not taken your lisinopril tablet today.  Your blood pressure is elevated here.  Make sure you take this medication as prescribed by your MD.

## 2020-12-08 NOTE — ED Provider Notes (Signed)
[facility] EMERGENCY DEPARTMENT Provider Note   CSN: [id number] Arrival date & time: 12/07/20  7829     History Chief Complaint  Patient presents with   Back Pain    Wesley Sexton is a 50 y.o. male with history of htn and pancreatitis presenting with a 3-day history of left lower back pain which radiates into his left buttock and thigh region.  He denies any specific injury or overuse.  He does have a history of occasional low back pain similar to today's presentation.  He denies weakness or numbness in his legs and has had no urinary or fecal incontinence or retention.  He has found no alleviators for his symptoms.  The history is provided by the patient.      Past Medical History:  Diagnosis Date   Hypertension    Pancreatitis     There are no problems to display for this patient.   Past Surgical History:  Procedure Laterality Date   "pin hole" in heart     LEG SURGERY         No family history on file.  Social History   Tobacco Use   Smoking status: Every Day    Packs/day: 0.50    Types: Cigarettes   Smokeless tobacco: Never  Vaping Use   Vaping Use: Never used  Substance Use Topics   Alcohol use: Yes    Comment: Drank beer last night   Drug use: Yes    Types: Cocaine, Marijuana    Comment: Used THC last night    Home Medications Prior to Admission medications   Medication Sig Start Date End Date Taking? Authorizing Provider  cyclobenzaprine (FLEXERIL) 5 MG tablet Take 1 tablet (5 mg total) by mouth 3 (three) times daily as needed for muscle spasms. 12/07/20  Yes Lennan Malone, Raynelle Fanning, PA-C  naproxen (NAPROSYN) 500 MG tablet Take 1 tablet (500 mg total) by mouth 2 (two) times daily. 12/07/20  Yes Epsie Walthall, Raynelle Fanning, PA-C  doxycycline (VIBRAMYCIN) 100 MG capsule Take 1 capsule (100 mg total) by mouth 2 (two) times daily. 01/11/19   Devoria Albe, MD  lisinopril (PRINIVIL,ZESTRIL) 5 MG tablet Take 5 mg by mouth daily.    [provider]  ondansetron (ZOFRAN ODT) 4 MG  disintegrating tablet 4mg  ODT q4 hours prn nausea/vomit 07/06/17   07/08/17, MD  pantoprazole (PROTONIX) 20 MG tablet Take 1 tablet (20 mg total) by mouth daily. 07/06/17   07/08/17, MD    Allergies    Patient has no known allergies.  Review of Systems   Review of Systems  Constitutional:  Negative for fever.  Respiratory:  Negative for shortness of breath.   Cardiovascular:  Negative for chest pain and leg swelling.  Gastrointestinal:  Negative for abdominal distention, abdominal pain and constipation.  Genitourinary:  Negative for difficulty urinating, dysuria, flank pain, frequency and urgency.  Musculoskeletal:  Positive for back pain. Negative for gait problem and joint swelling.  Skin:  Negative for rash.  Neurological:  Negative for weakness and numbness.  All other systems reviewed and are negative.  Physical Exam Updated Vital Signs BP (!) 159/102 (BP Location: Right Arm)   Pulse (!) 57   Temp 98 F (36.7 C) (Oral)   Resp 18   Ht 5\' 8"  (1.727 m)   Wt 76.5 kg   SpO2 100%   BMI 25.64 kg/m   Physical Exam Vitals and nursing note reviewed.  Constitutional:      Appearance: He is well-developed.  HENT:  Head: Normocephalic.  Eyes:     Conjunctiva/sclera: Conjunctivae normal.  Cardiovascular:     Rate and Rhythm: Normal rate.     Pulses: Normal pulses.     Comments: Pedal pulses normal. Pulmonary:     Effort: Pulmonary effort is normal.  Abdominal:     General: Bowel sounds are normal. There is no distension.     Palpations: Abdomen is soft. There is no mass.  Musculoskeletal:        General: Normal range of motion.     Cervical back: Normal range of motion and neck supple.     Lumbar back: Tenderness present. No swelling, edema or spasms.  Skin:    General: Skin is warm and dry.  Neurological:     Mental Status: He is alert.     Sensory: No sensory deficit.     Motor: No tremor or atrophy.     Gait: Gait normal.     Deep Tendon Reflexes:      Reflex Scores:      Patellar reflexes are 2+ on the right side and 2+ on the left side.      Achilles reflexes are 2+ on the right side and 2+ on the left side.    Comments: No strength deficit noted in hip and knee flexor and extensor muscle groups.  Ankle flexion and extension intact.    ED Results / Procedures / Treatments   Labs (all labs ordered are listed, but only abnormal results are displayed) Labs Reviewed - No data to display  EKG None  Radiology DG Lumbar Spine Complete  Result Date: 12/07/2020 CLINICAL DATA:  Chronic lower back and left leg pain without recent injury. EXAM: LUMBAR SPINE - COMPLETE 4+ VIEW COMPARISON:  October 19, 2020. FINDINGS: There is no evidence of lumbar spine fracture. Alignment is normal. Mild degenerative joint disease is noted at T12-L1. Remaining disc spaces are unremarkable. IMPRESSION: Mild degenerative disc disease at T12-L1. No acute abnormality seen. Electronically Signed   By: Lupita Raider M.D.   On: 12/07/2020 12:48    Procedures Procedures   Medications Ordered in ED Medications  cyclobenzaprine (FLEXERIL) tablet 10 mg (10 mg Oral Given 12/07/20 1208)  naproxen (NAPROSYN) tablet 500 mg (500 mg Oral Given 12/07/20 1208)    ED Course  I have reviewed the triage vital signs and the nursing notes.  Pertinent labs & imaging results that were available during my care of the patient were reviewed by me and considered in my medical decision making (see chart for details).    MDM Rules/Calculators/A&P                           No neuro deficit on exam or by history to suggest emergent or surgical presentation.  Also discussed worsened sx that should prompt immediate re-evaluation including distal weakness, bowel/bladder retention/incontinence.  Doubt cauda equina.  Also no abdominal pain or distention, no exam findings to suggest abdominal aneurysm.  He was started on Flexeril and Naprosyn.  We also discussed other home treatment including  roles of ice and heat.  Plan follow-up with his PCP for recheck in 1 week if symptoms are not greatly improved or resolved.  Patient blood pressure was noted to be elevated here.  He has not taken his lisinopril today, he was advised to take this once he gets home.  The patient appears reasonably screened and/or stabilized for discharge and I doubt any other medical  condition or other [facility] requiring further screening, evaluation, or treatment in the ED at this time prior to discharge.   Final Clinical Impression(s) / ED Diagnoses Final diagnoses:  Sciatica of left side  Primary hypertension    Rx / DC Orders ED Discharge Orders          Ordered    cyclobenzaprine (FLEXERIL) 5 MG tablet  3 times daily PRN        12/07/20 1301    naproxen (NAPROSYN) 500 MG tablet  2 times daily        12/07/20 1301             Victoriano Lain 12/08/20 2049    Kommor, Wyn Forster, MD 12/14/20 2234

## 2021-04-26 ENCOUNTER — Emergency Department (HOSPITAL_COMMUNITY)
Admission: EM | Admit: 2021-04-26 | Discharge: 2021-04-26 | Disposition: A | Discharge status: Home / Self Care | Payer: Medicaid Other | Attending: Emergency Medicine | Admitting: Emergency Medicine

## 2021-04-26 DIAGNOSIS — Z79899 Other long term (current) drug therapy: Secondary | ICD-10-CM | POA: Insufficient documentation

## 2021-04-26 DIAGNOSIS — I10 Essential (primary) hypertension: Secondary | ICD-10-CM | POA: Diagnosis not present

## 2021-04-26 LAB — CBC
HCT: 45.9 % (ref 39.0–52.0)
Hemoglobin: 14.7 g/dL (ref 13.0–17.0)
MCH: 28.4 pg (ref 26.0–34.0)
MCHC: 32 g/dL (ref 30.0–36.0)
MCV: 88.8 fL (ref 80.0–100.0)
Platelets: 308 10*3/uL (ref 150–400)
RBC: 5.17 MIL/uL (ref 4.22–5.81)
RDW: 14.2 % (ref 11.5–15.5)
WBC: 6.7 10*3/uL (ref 4.0–10.5)
nRBC: 0 % (ref 0.0–0.2)

## 2021-04-26 LAB — URINALYSIS, ROUTINE W REFLEX MICROSCOPIC
Bacteria, UA: NONE SEEN
Bilirubin Urine: NEGATIVE
Glucose, UA: NEGATIVE mg/dL
Hgb urine dipstick: NEGATIVE
Ketones, ur: NEGATIVE mg/dL
Nitrite: NEGATIVE
Protein, ur: NEGATIVE mg/dL
Specific Gravity, Urine: 1.011 (ref 1.005–1.030)
pH: 7 (ref 5.0–8.0)

## 2021-04-26 LAB — BASIC METABOLIC PANEL
Anion gap: 7 (ref 5–15)
BUN: 10 mg/dL (ref 6–20)
CO2: 26 mmol/L (ref 22–32)
Calcium: 8.6 mg/dL — ABNORMAL LOW (ref 8.9–10.3)
Chloride: 105 mmol/L (ref 98–111)
Creatinine, Ser: 0.99 mg/dL (ref 0.61–1.24)
GFR, Estimated: >60 mL/min (ref >60)
Glucose, Bld: 118 mg/dL — ABNORMAL HIGH (ref 70–99)
Potassium: 3.7 mmol/L (ref 3.5–5.1)
Sodium: 138 mmol/L (ref 135–145)

## 2021-04-26 MED ORDER — LISINOPRIL 10 MG PO TABS: 10.0000 mg | ORAL_TABLET | Freq: Every day | ORAL | 0 refills | Status: DC

## 2021-04-26 NOTE — ED Provider Notes (Signed)
[facility] EMERGENCY DEPARTMENT Provider Note   CSN: [id number] Arrival date & time: 04/26/21  1103     History  Chief Complaint  Patient presents with   Hypertension    Wesley Sexton is a 51 y.o. male.   Hypertension Associated symptoms include abdominal pain. Pertinent negatives include no chest pain and no shortness of breath. Patient reportedly sent in by PCP for hypertension.  History of hypertension but is been off his medications for around a year.  States he just stopped it.  States he also had some difficulty urinating.  No fevers.  Had some abdominal pain earlier today that is resolved.  No chest pain.  No trouble breathing.  No lightheadedness or dizziness.  No headaches.  No confusion.     Home Medications Prior to Admission medications   Medication Sig Start Date End Date Taking? Authorizing Provider  cyclobenzaprine (FLEXERIL) 5 MG tablet Take 1 tablet (5 mg total) by mouth 3 (three) times daily as needed for muscle spasms. Patient not taking: Reported on 04/26/2021 12/07/20   Burgess Amor, PA-C  doxycycline (VIBRAMYCIN) 100 MG capsule Take 1 capsule (100 mg total) by mouth 2 (two) times daily. Patient not taking: Reported on 04/26/2021 01/11/19   Devoria Albe, MD  lisinopril (ZESTRIL) 10 MG tablet Take 1 tablet (10 mg total) by mouth daily. 04/26/21   Benjiman Core, MD  naproxen (NAPROSYN) 500 MG tablet Take 1 tablet (500 mg total) by mouth 2 (two) times daily. Patient not taking: Reported on 04/26/2021 12/07/20   Burgess Amor, PA-C  ondansetron (ZOFRAN ODT) 4 MG disintegrating tablet 4mg  ODT q4 hours prn nausea/vomit Patient not taking: Reported on 04/26/2021 07/06/17   07/08/17, MD  pantoprazole (PROTONIX) 20 MG tablet Take 1 tablet (20 mg total) by mouth daily. Patient not taking: Reported on 04/26/2021 07/06/17   07/08/17, MD      Allergies    Patient has no known allergies.    Review of Systems   Review of Systems  Constitutional:  Negative for appetite  change.  HENT:  Negative for congestion.   Respiratory:  Negative for shortness of breath.   Cardiovascular:  Negative for chest pain.  Gastrointestinal:  Positive for abdominal pain.  Genitourinary:  Positive for dysuria.  Musculoskeletal:  Negative for back pain.  Skin:  Negative for rash.  Neurological:  Negative for weakness.  Psychiatric/Behavioral:  Negative for confusion.    Physical Exam Updated Vital Signs BP (!) 188/145    Pulse 82    Temp 97.9 F (36.6 C) (Oral)    Resp 15    Ht 5\' 8"  (1.727 m)    Wt 77 kg    SpO2 99%    BMI 25.81 kg/m  Physical Exam Vitals and nursing note reviewed.  HENT:     Head: Atraumatic.  Eyes:     Pupils: Pupils are equal, round, and reactive to light.  Cardiovascular:     Rate and Rhythm: Regular rhythm.  Pulmonary:     Breath sounds: Normal breath sounds.  Abdominal:     Tenderness: There is no abdominal tenderness.  Musculoskeletal:        General: No tenderness.  Skin:    General: Skin is warm.     Capillary Refill: Capillary refill takes less than 2 seconds.  Neurological:     Mental Status: He is alert and oriented to person, place, and time.    ED Results / Procedures / Treatments   Labs (all labs ordered  are listed, but only abnormal results are displayed) Labs Reviewed  BASIC METABOLIC PANEL - Abnormal; Notable for the following components:      Result Value   Glucose, Bld 118 (*)    Calcium 8.6 (*)    All other components within normal limits  CBC  URINALYSIS, ROUTINE W REFLEX MICROSCOPIC    EKG EKG Interpretation  Date/Time:  Monday April 26 2021 13:06:49 EST Ventricular Rate:  62 PR Interval:  123 QRS Duration: 97 QT Interval:  487 QTC Calculation: 495 R Axis:   52 Text Interpretation: Sinus rhythm Biatrial enlargement RSR' in V1 or V2, probably normal variant Nonspecific T abnrm, anterolateral leads ST elev, probable normal early repol pattern Borderline prolonged QT interval No old tracing to compare  Confirmed by Benjiman Core [phone]) on 04/26/2021 2:00:50 PM  Radiology No results found.  Procedures Procedures    Medications Ordered in ED Medications - No data to display  ED Course/ Medical Decision Making/ A&P                           Medical Decision Making Amount and/or Complexity of Data Reviewed External Data Reviewed: notes. Labs: ordered. ECG/medicine tests: independent interpretation performed.  Risk Prescription drug management.   Patient with hypertension.  Had been reportedly asymptomatic.  Came in for some dysuria to PCP and found to have hypertension.  No endorgan damage found.  EKG reassuring.  Good kidney function.  Waiting on urine to result but likely discharge home.  Care turned over to Dr. Durwin Nora.  Outpatient follow-up for more management of his hypertension.        Final Clinical Impression(s) / ED Diagnoses Final diagnoses:  Hypertension, unspecified type    Rx / DC Orders ED Discharge Orders          Ordered    lisinopril (ZESTRIL) 10 MG tablet  Daily        04/26/21 1532              Benjiman Core, MD 04/26/21 1534

## 2021-04-26 NOTE — ED Provider Notes (Signed)
51 year old male with history of hypertension, off of medications for the past year, presents at the request of his PCP for evaluation following elevated blood pressures.  He is also had dysuria.  His work-up has been reassuring.  We will follow-up on results of urinalysis.  He is being prescribed his previous medication and will be appropriate for discharge plus or minus antibiotics. Physical Exam  BP (!) 188/145    Pulse 82    Temp 97.9 F (36.6 C) (Oral)    Resp 15    Ht 5\' 8"  (1.727 m)    Wt 77 kg    SpO2 99%    BMI 25.81 kg/m   Physical Exam Constitutional:      Appearance: Normal appearance.  Skin:    General: Skin is warm and dry.  Neurological:     General: No focal deficit present.     Mental Status: He is alert and oriented to person, place, and time.  Psychiatric:        Mood and Affect: Mood normal.        Behavior: Behavior normal.    Procedures  Procedures  ED Course / MDM    Medical Decision Making  Urinalysis without evidence of infection.  Patient was discharged in good condition.       , MD 04/27/21 [phone]

## 2021-04-26 NOTE — ED Triage Notes (Signed)
Patient referred from PCP due to elevated blood pressure, patient does not take medication as directed. Also has dysuria

## 2021-11-19 ENCOUNTER — Encounter (HOSPITAL_COMMUNITY): Payer: Self-pay | Admitting: Emergency Medicine

## 2021-11-19 ENCOUNTER — Emergency Department (HOSPITAL_COMMUNITY)
Admission: EM | Admit: 2021-11-19 | Discharge: 2021-11-19 | Disposition: A | Discharge status: Home / Self Care | Payer: Medicaid Other | Attending: Emergency Medicine | Admitting: Emergency Medicine

## 2021-11-19 ENCOUNTER — Emergency Department (HOSPITAL_COMMUNITY): Payer: Medicaid Other

## 2021-11-19 ENCOUNTER — Other Ambulatory Visit: Payer: Self-pay

## 2021-11-19 DIAGNOSIS — S0181XA Laceration without foreign body of other part of head, initial encounter: Secondary | ICD-10-CM | POA: Insufficient documentation

## 2021-11-19 DIAGNOSIS — Z79899 Other long term (current) drug therapy: Secondary | ICD-10-CM | POA: Insufficient documentation

## 2021-11-19 DIAGNOSIS — S01112A Laceration without foreign body of left eyelid and periocular area, initial encounter: Secondary | ICD-10-CM | POA: Diagnosis not present

## 2021-11-19 DIAGNOSIS — H1132 Conjunctival hemorrhage, left eye: Secondary | ICD-10-CM | POA: Insufficient documentation

## 2021-11-19 DIAGNOSIS — Z23 Encounter for immunization: Secondary | ICD-10-CM | POA: Diagnosis not present

## 2021-11-19 DIAGNOSIS — S0993XA Unspecified injury of face, initial encounter: Secondary | ICD-10-CM | POA: Diagnosis present

## 2021-11-19 MED ORDER — NAPROXEN 250 MG PO TABS
500.0000 mg | ORAL_TABLET | Freq: Once | ORAL | Status: AC
  Administered 2021-11-19: 500 mg via ORAL
  Filled 2021-11-19: qty 2

## 2021-11-19 MED ORDER — TETANUS-DIPHTH-ACELL PERTUSSIS 5-2.5-18.5 LF-MCG/0.5 IM SUSY
0.5000 mL | PREFILLED_SYRINGE | Freq: Once | INTRAMUSCULAR | Status: AC
  Administered 2021-11-19: 0.5 mL via INTRAMUSCULAR
  Filled 2021-11-19: qty 0.5
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
## 2021-11-19 NOTE — Discharge Instructions (Addendum)
The liquid stitch will start to come off within about 7 days, once it is halfway off you can gently pull the rest of it off.  Acetaminophen or ibuprofen as needed for pain  Emergency department for severe or worsening symptoms  I would like for you to see the eye doctor within 48 hours.  I gave you the phone number for Dr. Zenaida Niece, on-call for eye doctors today.  Please call the office in the morning on Monday and make an appointment for close follow-up   Emergency department for severe worsening symptom

## 2021-12-31 ENCOUNTER — Encounter: Payer: Self-pay | Admitting: *Deleted

## 2022-05-01 ENCOUNTER — Encounter (HOSPITAL_COMMUNITY): Payer: Self-pay | Admitting: Emergency Medicine

## 2022-05-01 ENCOUNTER — Emergency Department (HOSPITAL_COMMUNITY): Payer: Medicaid Other

## 2022-05-01 ENCOUNTER — Other Ambulatory Visit: Payer: Self-pay

## 2022-05-01 ENCOUNTER — Inpatient Hospital Stay (HOSPITAL_COMMUNITY)
Admission: EM | Admit: 2022-05-01 | Discharge: 2022-05-06 | DRG: 694 | Discharge status: Against Medical Advice | Payer: Medicaid Other | Attending: Family Medicine | Admitting: Family Medicine

## 2022-05-01 DIAGNOSIS — E872 Acidosis, unspecified: Secondary | ICD-10-CM | POA: Diagnosis present

## 2022-05-01 DIAGNOSIS — Z91013 Allergy to seafood: Secondary | ICD-10-CM

## 2022-05-01 DIAGNOSIS — F1490 Cocaine use, unspecified, uncomplicated: Secondary | ICD-10-CM | POA: Diagnosis present

## 2022-05-01 DIAGNOSIS — F17210 Nicotine dependence, cigarettes, uncomplicated: Secondary | ICD-10-CM | POA: Diagnosis present

## 2022-05-01 DIAGNOSIS — E875 Hyperkalemia: Secondary | ICD-10-CM | POA: Insufficient documentation

## 2022-05-01 DIAGNOSIS — N1330 Unspecified hydronephrosis: Principal | ICD-10-CM | POA: Insufficient documentation

## 2022-05-01 DIAGNOSIS — R310 Gross hematuria: Secondary | ICD-10-CM | POA: Insufficient documentation

## 2022-05-01 DIAGNOSIS — R339 Retention of urine, unspecified: Secondary | ICD-10-CM

## 2022-05-01 DIAGNOSIS — M47816 Spondylosis without myelopathy or radiculopathy, lumbar region: Secondary | ICD-10-CM | POA: Diagnosis present

## 2022-05-01 DIAGNOSIS — M545 Low back pain, unspecified: Secondary | ICD-10-CM | POA: Insufficient documentation

## 2022-05-01 DIAGNOSIS — N139 Obstructive and reflux uropathy, unspecified: Secondary | ICD-10-CM | POA: Diagnosis present

## 2022-05-01 DIAGNOSIS — F1290 Cannabis use, unspecified, uncomplicated: Secondary | ICD-10-CM | POA: Diagnosis present

## 2022-05-01 DIAGNOSIS — N401 Enlarged prostate with lower urinary tract symptoms: Secondary | ICD-10-CM | POA: Diagnosis present

## 2022-05-01 DIAGNOSIS — Z5329 Procedure and treatment not carried out because of patient's decision for other reasons: Secondary | ICD-10-CM | POA: Diagnosis not present

## 2022-05-01 DIAGNOSIS — G8929 Other chronic pain: Secondary | ICD-10-CM | POA: Diagnosis present

## 2022-05-01 DIAGNOSIS — D649 Anemia, unspecified: Secondary | ICD-10-CM | POA: Diagnosis present

## 2022-05-01 DIAGNOSIS — N3289 Other specified disorders of bladder: Secondary | ICD-10-CM | POA: Insufficient documentation

## 2022-05-01 DIAGNOSIS — N320 Bladder-neck obstruction: Secondary | ICD-10-CM | POA: Diagnosis present

## 2022-05-01 DIAGNOSIS — I160 Hypertensive urgency: Secondary | ICD-10-CM | POA: Diagnosis present

## 2022-05-01 DIAGNOSIS — R338 Other retention of urine: Secondary | ICD-10-CM | POA: Diagnosis present

## 2022-05-01 DIAGNOSIS — I10 Essential (primary) hypertension: Secondary | ICD-10-CM | POA: Insufficient documentation

## 2022-05-01 DIAGNOSIS — E871 Hypo-osmolality and hyponatremia: Secondary | ICD-10-CM | POA: Diagnosis not present

## 2022-05-01 DIAGNOSIS — N179 Acute kidney failure, unspecified: Secondary | ICD-10-CM

## 2022-05-01 LAB — URINALYSIS, ROUTINE W REFLEX MICROSCOPIC
Bilirubin Urine: NEGATIVE
Glucose, UA: NEGATIVE mg/dL
Ketones, ur: NEGATIVE mg/dL
Nitrite: NEGATIVE
Protein, ur: 30 mg/dL — AB
Specific Gravity, Urine: 1.008 (ref 1.005–1.030)
WBC, UA: >50 WBC/hpf — ABNORMAL HIGH (ref 0–5)
pH: 6 (ref 5.0–8.0)

## 2022-05-01 LAB — BASIC METABOLIC PANEL
Anion gap: 7 (ref 5–15)
BUN: 53 mg/dL — ABNORMAL HIGH (ref 6–20)
CO2: 19 mmol/L — ABNORMAL LOW (ref 22–32)
Calcium: 8.5 mg/dL — ABNORMAL LOW (ref 8.9–10.3)
Chloride: 112 mmol/L — ABNORMAL HIGH (ref 98–111)
Creatinine, Ser: 6.41 mg/dL — ABNORMAL HIGH (ref 0.61–1.24)
GFR, Estimated: 10 mL/min — ABNORMAL LOW (ref >60)
Glucose, Bld: 91 mg/dL (ref 70–99)
Potassium: 5.2 mmol/L — ABNORMAL HIGH (ref 3.5–5.1)
Sodium: 138 mmol/L (ref 135–145)

## 2022-05-01 LAB — CBC
HCT: 35.3 % — ABNORMAL LOW (ref 39.0–52.0)
Hemoglobin: 10.9 g/dL — ABNORMAL LOW (ref 13.0–17.0)
MCH: 26.8 pg (ref 26.0–34.0)
MCHC: 30.9 g/dL (ref 30.0–36.0)
MCV: 86.7 fL (ref 80.0–100.0)
Platelets: 477 10*3/uL — ABNORMAL HIGH (ref 150–400)
RBC: 4.07 MIL/uL — ABNORMAL LOW (ref 4.22–5.81)
RDW: 14.4 % (ref 11.5–15.5)
WBC: 7 10*3/uL (ref 4.0–10.5)
nRBC: 0 % (ref 0.0–0.2)

## 2022-05-01 MED ORDER — SODIUM CHLORIDE 0.9 % IV SOLN
1.0000 g | Freq: Once | INTRAVENOUS | Status: AC
  Administered 2022-05-02: 1 g via INTRAVENOUS
  Filled 2022-05-01: qty 10

## 2022-05-01 NOTE — ED Notes (Addendum)
Pt back and forth to the bathroom for a bowel movement. Blood and small clots noted in catheter tubing. 800 ml urine noted in catheter bag.

## 2022-05-01 NOTE — ED Triage Notes (Signed)
Pt from home, c/o lower back pain that radiates down both legs that's been going on for 2xweeks that has gotten worse. Pt states at times left flank hurts. Pt states that he has to strain to urinate and at night is incontinent. Denies fever, n/v/d.

## 2022-05-01 NOTE — ED Notes (Signed)
Pt c/o irritation and itching to penis since catheter placement. No visible rash or hives noted.

## 2022-05-01 NOTE — ED Provider Notes (Signed)
[facility] EMERGENCY DEPARTMENT Provider Note   CSN: [id number] Arrival date & time: 05/01/22  2042     History  No chief complaint on file.   Wesley Sexton is a 52 y.o. male.  With history of hypertension who presents to the ED for evaluation of bilateral low back pain.  He states his pain has been present for the past year, but got significantly worse 2 weeks ago.  Denies trauma.  Specifically denies being struck, falling, or lifting anything heavy.  States that this came on suddenly.  It is described as a tight and aching pain that he currently rates at a 9 out of 10.  States he has not taken anything for the pain since it began.  Denies radiation down the back of either leg.  States he does get bilateral knee pain at times that goes away with massage.  Denies daytime urinary or fecal incontinence.  States he has had nocturnal enuresis for the past 8 to 9 months.  He has seen his primary care provider about this but has not followed up with a specialist.  He reports daytime straining for urination but denies dysuria, frequency or urgency.  Denies fevers.  Admits to cocaine and marijuana use, but denies injection drug use.  Pain is worsened with rotation and extension of the spine.  It is slightly improved with flexion.  Denies chest pain, shortness of breath, nausea, vomiting, abdominal pain, diarrhea.  HPI     Home Medications Prior to Admission medications   Medication Sig Start Date End Date Taking? Authorizing Provider  lisinopril (ZESTRIL) 10 MG tablet Take 1 tablet (10 mg total) by mouth daily. 04/26/21   Davonna Belling, MD      Allergies    Shellfish allergy    Review of Systems   Review of Systems  Musculoskeletal:  Positive for back pain.  All other systems reviewed and are negative.   Physical Exam Updated Vital Signs BP (!) 154/110   Pulse 80   Temp 98.5 F (36.9 C) (Oral)   Resp 18   Ht 5\' 8"  (1.727 m)   Wt 70.3 kg   SpO2 100%   BMI 23.57 kg/m  Physical  Exam Vitals and nursing note reviewed.  Constitutional:      General: He is not in acute distress.    Appearance: He is well-developed.  HENT:     Head: Normocephalic and atraumatic.  Eyes:     Conjunctiva/sclera: Conjunctivae normal.  Cardiovascular:     Rate and Rhythm: Normal rate and regular rhythm.     Heart sounds: No murmur heard. Pulmonary:     Effort: Pulmonary effort is normal. No respiratory distress.     Breath sounds: Normal breath sounds. No stridor. No wheezing, rhonchi or rales.  Abdominal:     Palpations: Abdomen is soft.     Tenderness: There is no abdominal tenderness.  Musculoskeletal:        General: No swelling.     Cervical back: Normal range of motion and neck supple.     Lumbar back: Negative right straight leg raise test and negative left straight leg raise test.     Comments: No midline C or T-spine tenderness.  Diffuse tenderness to palpation of the lumbar area.  Straight leg raise negative bilaterally.  Skin:    General: Skin is warm and dry.     Capillary Refill: Capillary refill takes less than 2 seconds.  Neurological:     Mental Status: He is  alert.  Psychiatric:        Mood and Affect: Mood normal.     ED Results / Procedures / Treatments   Labs (all labs ordered are listed, but only abnormal results are displayed) Labs Reviewed  URINALYSIS, ROUTINE W REFLEX MICROSCOPIC - Abnormal; Notable for the following components:      Result Value   APPearance CLOUDY (*)    Hgb urine dipstick SMALL (*)    Protein, ur 30 (*)    Leukocytes,Ua LARGE (*)    WBC, UA >50 (*)    Bacteria, UA RARE (*)    All other components within normal limits  BASIC METABOLIC PANEL - Abnormal; Notable for the following components:   Potassium 5.2 (*)    Chloride 112 (*)    CO2 19 (*)    BUN 53 (*)    Creatinine, Ser 6.41 (*)    Calcium 8.5 (*)    GFR, Estimated 10 (*)    All other components within normal limits  CBC - Abnormal; Notable for the following  components:   RBC 4.07 (*)    Hemoglobin 10.9 (*)    HCT 35.3 (*)    Platelets 477 (*)    All other components within normal limits    EKG None  Radiology No results found.  Procedures Procedures    Medications Ordered in ED Medications  cefTRIAXone (ROCEPHIN) 1 g in sodium chloride 0.9 % 100 mL IVPB (has no administration in time range)    ED Course/ Medical Decision Making/ A&P                             Medical Decision Making Amount and/or Complexity of Data Reviewed Labs: ordered.  This patient presents to the ED for concern of low back pain, this involves an extensive number of treatment options, and is a complaint that carries with it a high risk of complications and morbidity.   The emergent differential diagnosis for back pain includes but is not limited to fracture, muscle strain, cauda equina, spinal stenosis. DDD, ankylosing spondylitis, acute ligamentous injury, disk herniation, spondylolisthesis, Epidural compression syndrome, metastatic cancer, transverse myelitis, vertebral osteomyelitis, diskitis, kidney stone, pyelonephritis, AAA, Perforated ulcer, Retrocecal appendicitis, pancreatitis, bowel obstruction, retroperitoneal hemorrhage or mass, meningitis.  Co morbidities that complicate the patient evaluation   hypertension  My initial workup includes basic labs, urinalysis  Additional history obtained from: Nursing notes from this visit.  I ordered, reviewed and interpreted labs which include: BMP, CBC, urinalysis.  BMP significant for hypokalemia 5.2.  Significantly elevated creatinine of 6.41 and BUN of 53.  GFR of 10.  Findings consistent with acute renal failure.  CBC shows anemia of 10.9 and thrombocytosis of 477.  Urinalysis shows signs of UTI with large leukocytes, greater than 50 white blood cells and rare bacteria.  I ordered imaging studies including CT abdomen pelvis without contrast. Official read pending at the time of shift  change.  Afebrile, hemodynamically stable.  52 year old male presenting to the ED for evaluation of low back pain.  He does report nocturnal enuresis, but denies daytime incontinence.  Denies all other symptoms of cord compression.  Low suspicion for this.  Physical exam is remarkable for tenderness to palpation of the entire low back.  Labs remarkable for an anemia of 10.9 which is decreased from his baseline of 14.7 approximately 1 year ago.  He has no symptoms of anemia.  His diet and is significantly  elevated to 6.41.  No labs within the last year to compare.  Bedside ultrasound was performed and showed significant bladder distention and hydronephrosis.  CT abdomen pelvis without contrast was ordered due to this and showed significant bladder distention and bilateral hydronephrosis.  Official reading pending at the time of shift change.  His urinalysis did show signs of UTI.  IV Rocephin was initiated.  Plan is to admit patient for acute renal failure, hydronephrosis, urinary retention.  Care will be handed off to Dr. Karle Starch at shift change.  Please see his note for final decision making and disposition.  Plan may change at the discretion of the oncoming provider.  Patient's case discussed with Dr. Sabra Heck who agrees with plan to admit.   Note: Portions of this report may have been transcribed using voice recognition software. Every effort was made to ensure accuracy; however, inadvertent computerized transcription errors may still be present.         Final Clinical Impression(s) / ED Diagnoses Final diagnoses:  Acute renal failure, unspecified acute renal failure type ([place])  Hydronephrosis, unspecified hydronephrosis type  Urinary retention    Rx / DC Orders ED Discharge Orders     None         Nehemiah Massed 05/01/22 2311    Noemi Chapel, MD 05/01/22 2354

## 2022-05-01 NOTE — ED Notes (Signed)
Pt complaining of itching in penis, pus noted at tip of penis and blood in urine bag, provider notified

## 2022-05-02 DIAGNOSIS — E875 Hyperkalemia: Secondary | ICD-10-CM | POA: Insufficient documentation

## 2022-05-02 DIAGNOSIS — I160 Hypertensive urgency: Secondary | ICD-10-CM | POA: Diagnosis present

## 2022-05-02 DIAGNOSIS — R339 Retention of urine, unspecified: Secondary | ICD-10-CM

## 2022-05-02 DIAGNOSIS — N1330 Unspecified hydronephrosis: Secondary | ICD-10-CM

## 2022-05-02 DIAGNOSIS — I10 Essential (primary) hypertension: Secondary | ICD-10-CM | POA: Insufficient documentation

## 2022-05-02 DIAGNOSIS — F17210 Nicotine dependence, cigarettes, uncomplicated: Secondary | ICD-10-CM | POA: Diagnosis present

## 2022-05-02 DIAGNOSIS — F1490 Cocaine use, unspecified, uncomplicated: Secondary | ICD-10-CM | POA: Diagnosis present

## 2022-05-02 DIAGNOSIS — Z91013 Allergy to seafood: Secondary | ICD-10-CM | POA: Diagnosis not present

## 2022-05-02 DIAGNOSIS — E871 Hypo-osmolality and hyponatremia: Secondary | ICD-10-CM | POA: Diagnosis not present

## 2022-05-02 DIAGNOSIS — Z5329 Procedure and treatment not carried out because of patient's decision for other reasons: Secondary | ICD-10-CM | POA: Diagnosis not present

## 2022-05-02 DIAGNOSIS — R310 Gross hematuria: Secondary | ICD-10-CM | POA: Diagnosis present

## 2022-05-02 DIAGNOSIS — N139 Obstructive and reflux uropathy, unspecified: Secondary | ICD-10-CM | POA: Diagnosis not present

## 2022-05-02 DIAGNOSIS — M545 Low back pain, unspecified: Secondary | ICD-10-CM | POA: Insufficient documentation

## 2022-05-02 DIAGNOSIS — F1290 Cannabis use, unspecified, uncomplicated: Secondary | ICD-10-CM | POA: Diagnosis present

## 2022-05-02 DIAGNOSIS — N3289 Other specified disorders of bladder: Secondary | ICD-10-CM | POA: Diagnosis present

## 2022-05-02 DIAGNOSIS — N401 Enlarged prostate with lower urinary tract symptoms: Secondary | ICD-10-CM | POA: Diagnosis present

## 2022-05-02 DIAGNOSIS — G8929 Other chronic pain: Secondary | ICD-10-CM | POA: Diagnosis present

## 2022-05-02 DIAGNOSIS — E872 Acidosis, unspecified: Secondary | ICD-10-CM | POA: Diagnosis present

## 2022-05-02 DIAGNOSIS — M47816 Spondylosis without myelopathy or radiculopathy, lumbar region: Secondary | ICD-10-CM | POA: Diagnosis present

## 2022-05-02 DIAGNOSIS — N179 Acute kidney failure, unspecified: Secondary | ICD-10-CM

## 2022-05-02 DIAGNOSIS — D649 Anemia, unspecified: Secondary | ICD-10-CM | POA: Diagnosis present

## 2022-05-02 DIAGNOSIS — R338 Other retention of urine: Secondary | ICD-10-CM | POA: Diagnosis present

## 2022-05-02 DIAGNOSIS — N320 Bladder-neck obstruction: Secondary | ICD-10-CM | POA: Diagnosis present

## 2022-05-02 LAB — CBC
HCT: 41.6 % (ref 39.0–52.0)
Hemoglobin: 12.8 g/dL — ABNORMAL LOW (ref 13.0–17.0)
MCH: 26.8 pg (ref 26.0–34.0)
MCHC: 30.8 g/dL (ref 30.0–36.0)
MCV: 87.2 fL (ref 80.0–100.0)
Platelets: 610 10*3/uL — ABNORMAL HIGH (ref 150–400)
RBC: 4.77 MIL/uL (ref 4.22–5.81)
RDW: 14.6 % (ref 11.5–15.5)
WBC: 13.8 10*3/uL — ABNORMAL HIGH (ref 4.0–10.5)
nRBC: 0 % (ref 0.0–0.2)

## 2022-05-02 LAB — CREATININE, SERUM
Creatinine, Ser: 6 mg/dL — ABNORMAL HIGH (ref 0.61–1.24)
GFR, Estimated: 11 mL/min — ABNORMAL LOW (ref >60)

## 2022-05-02 LAB — BASIC METABOLIC PANEL
Anion gap: 12 (ref 5–15)
BUN: 49 mg/dL — ABNORMAL HIGH (ref 6–20)
CO2: 16 mmol/L — ABNORMAL LOW (ref 22–32)
Calcium: 8.8 mg/dL — ABNORMAL LOW (ref 8.9–10.3)
Chloride: 105 mmol/L (ref 98–111)
Creatinine, Ser: 5.8 mg/dL — ABNORMAL HIGH (ref 0.61–1.24)
GFR, Estimated: 11 mL/min — ABNORMAL LOW (ref >60)
Glucose, Bld: 139 mg/dL — ABNORMAL HIGH (ref 70–99)
Potassium: 6.4 mmol/L (ref 3.5–5.1)
Sodium: 133 mmol/L — ABNORMAL LOW (ref 135–145)

## 2022-05-02 LAB — POTASSIUM: Potassium: 5.2 mmol/L — ABNORMAL HIGH (ref 3.5–5.1)

## 2022-05-02 LAB — HIV ANTIBODY (ROUTINE TESTING W REFLEX): HIV Screen 4th Generation wRfx: NONREACTIVE

## 2022-05-02 MED ORDER — HYDROMORPHONE HCL 1 MG/ML IJ SOLN
INTRAMUSCULAR | Status: AC
  Filled 2022-05-02: qty 0.5

## 2022-05-02 MED ORDER — SODIUM CHLORIDE 0.9 % IV SOLN: INTRAVENOUS | Status: DC

## 2022-05-02 MED ORDER — HYDRALAZINE HCL 20 MG/ML IJ SOLN
10.0000 mg | Freq: Four times a day (QID) | INTRAMUSCULAR | Status: DC | PRN
  Administered 2022-05-02 – 2022-05-03 (×2): 10 mg via INTRAVENOUS
  Filled 2022-05-02 (×2): qty 1

## 2022-05-02 MED ORDER — SODIUM POLYSTYRENE SULFONATE 15 GM/60ML PO SUSP
15.0000 g | Freq: Once | ORAL | Status: AC
  Administered 2022-05-02: 15 g via ORAL
  Filled 2022-05-02: qty 60

## 2022-05-02 MED ORDER — HYDRALAZINE HCL 25 MG PO TABS
50.0000 mg | ORAL_TABLET | Freq: Three times a day (TID) | ORAL | Status: DC
  Administered 2022-05-02 – 2022-05-03 (×3): 50 mg via ORAL
  Filled 2022-05-02 (×3): qty 2

## 2022-05-02 MED ORDER — INSULIN ASPART 100 UNIT/ML IV SOLN
10.0000 [IU] | Freq: Once | INTRAVENOUS | Status: AC
  Administered 2022-05-02: 10 [IU] via INTRAVENOUS

## 2022-05-02 MED ORDER — HEPARIN SODIUM (PORCINE) 5000 UNIT/ML IJ SOLN
5000.0000 [IU] | Freq: Three times a day (TID) | INTRAMUSCULAR | Status: DC
Start: 2022-05-02 — End: 2022-05-06
  Administered 2022-05-02 – 2022-05-05 (×6): 5000 [IU] via SUBCUTANEOUS
  Filled 2022-05-02 (×7): qty 1

## 2022-05-02 MED ORDER — DEXTROSE 50 % IV SOLN
1.0000 | Freq: Once | INTRAVENOUS | Status: AC
  Administered 2022-05-02: 50 mL via INTRAVENOUS
  Filled 2022-05-02: qty 50

## 2022-05-02 MED ORDER — HYDROCORTISONE 1 % EX CREA
TOPICAL_CREAM | Freq: Once | CUTANEOUS | Status: AC
  Filled 2022-05-02: qty 28

## 2022-05-02 MED ORDER — OXYBUTYNIN CHLORIDE 5 MG PO TABS
5.0000 mg | ORAL_TABLET | Freq: Three times a day (TID) | ORAL | Status: DC | PRN
  Administered 2022-05-02 – 2022-05-05 (×3): 5 mg via ORAL
  Filled 2022-05-02 (×3): qty 1

## 2022-05-02 MED ORDER — ONDANSETRON HCL 4 MG PO TABS: 4.0000 mg | ORAL_TABLET | Freq: Four times a day (QID) | ORAL | Status: DC | PRN

## 2022-05-02 MED ORDER — LISINOPRIL 10 MG PO TABS: 10.0000 mg | ORAL_TABLET | Freq: Every day | ORAL | Status: DC

## 2022-05-02 MED ORDER — HYDROMORPHONE HCL 1 MG/ML IJ SOLN
0.5000 mg | INTRAMUSCULAR | Status: DC | PRN
  Administered 2022-05-02 – 2022-05-06 (×13): 0.5 mg via INTRAVENOUS
  Filled 2022-05-02 (×12): qty 0.5

## 2022-05-02 MED ORDER — ONDANSETRON HCL 4 MG/2ML IJ SOLN: 4.0000 mg | Freq: Four times a day (QID) | INTRAMUSCULAR | Status: DC | PRN

## 2022-05-02 MED ORDER — AMLODIPINE BESYLATE 5 MG PO TABS
5.0000 mg | ORAL_TABLET | Freq: Every day | ORAL | Status: DC
  Administered 2022-05-02 – 2022-05-06 (×5): 5 mg via ORAL
  Filled 2022-05-02 (×6): qty 1

## 2022-05-02 MED ORDER — ACETAMINOPHEN 650 MG RE SUPP: 650.0000 mg | Freq: Four times a day (QID) | RECTAL | Status: DC | PRN

## 2022-05-02 MED ORDER — ACETAMINOPHEN 325 MG PO TABS
650.0000 mg | ORAL_TABLET | Freq: Four times a day (QID) | ORAL | Status: DC | PRN
  Administered 2022-05-02: 650 mg via ORAL
  Filled 2022-05-02: qty 2

## 2022-05-02 NOTE — ED Notes (Signed)
Pt requesting pain medication for lower abdominal and back pain. Reports penile itching has improved with hydrocortisone cream

## 2022-05-02 NOTE — ED Notes (Signed)
Receiving nurse to call back for report.  

## 2022-05-02 NOTE — Progress Notes (Signed)
  Transition of Care [facility]) Screening Note   Patient Details  Name: Wesley Sexton Date of Birth: June 29, 1970   Transition of Care [facility]) CM/SW Contact:    Ihor Gully, LCSW Phone Number: 05/02/2022, 2:34 PM    Transition of Care Department [facility]) has reviewed patient and no TOC needs have been identified at this time. We will continue to monitor patient advancement through interdisciplinary progression rounds. If new patient transition needs arise, please place a TOC consult.

## 2022-05-02 NOTE — ED Notes (Signed)
Nurse aware of elevated bp. Encourage to follow up if the hydralazine dose is not effective.

## 2022-05-02 NOTE — Progress Notes (Signed)
Patient request to have IV access moved to the left arm because the right arm was getting sore. IV access restarted on the left arm. Patient tolerated well.

## 2022-05-02 NOTE — H&P (Signed)
History and Physical    Patient: Wesley Sexton VOZ:[id number] DOB: 1970/12/19 DOA: 05/01/2022 DOS: the patient was seen and examined on 05/02/2022 PCP: [facility]  Patient coming from: Home  Chief Complaint: No chief complaint on file.  HPI: Wesley Sexton is a 52 y.o. male with medical history significant of hypertension who presents to the emergency department due to bilateral low back pain.  Patient complained of chronic bilateral low back pain that has been ongoing for 1 year, but this really worsened within the last 2 weeks, he denies any fall, trauma, lifting weights.  Pain was tight and aching and rated as 9/10 on pain scale, there was no radiation to the lower extremities, pain was exacerbated with extension and rotation of the spine and alleviated with flexion..  Patient also complained of 8 to 58-month onset of nocturnal enuresis without any urinary incontinence or fecal incontinence during the day, he has followed up with his PCP regarding this, but yet still follow-up with a specialist.  Patient complains of having to strain to urinate during the day, but denies fever, urinary frequency, urgency or painful urination.  ED Course:  In the emergency department, BP was 157/102, but other vital signs were within normal range.  Workup in the ED showed normocytic anemia and platelets of 477, BMP showed sodium 138, potassium 5.2, chloride 112, bicarb 19, BUN/creatinine 53/6.41 (creatinine about 1 year ago was 0.99).  Urinalysis was positive for large leukocytes but rare bacteria. CT abdomen and pelvis without contrast showed severe bilateral hydroureteronephrosis with associated urinary bladder wall thickening. Limited evaluation on this noncontrast study. This may reflect chronic changes of obstructive uropathy or reflux. Underlying malignancy not excluded Patient was treated with IV ceftriaxone, hydrocortisone cream was given, IV Dilaudid was provided.  Foley catheter  was inserted with hematuria being noted in the Foley catheter.  Patient continues to complain of straining despite Foley catheterization.  Review of Systems: Review of systems as noted in the HPI. All other systems reviewed and are negative.   Past Medical History:  Diagnosis Date   Hypertension    Pancreatitis    Past Surgical History:  Procedure Laterality Date   "pin hole" in heart     LEG SURGERY      Social History:  reports that he has been smoking cigarettes. He has been smoking an average of .5 packs per day. He has never used smokeless tobacco. He reports current alcohol use. He reports current drug use. Drugs: Cocaine and Marijuana.   Allergies  Allergen Reactions   Shellfish Allergy     History reviewed. No pertinent family history.   Prior to Admission medications   Medication Sig Start Date End Date Taking? Authorizing Provider  lisinopril (ZESTRIL) 10 MG tablet Take 1 tablet (10 mg total) by mouth daily. 04/26/21   Davonna Belling, MD    Physical Exam: BP (!) 171/95   Pulse 78   Temp 98 F (36.7 C) (Oral)   Resp 20   Ht 5\' 8"  (1.727 m)   Wt 70.3 kg   SpO2 100%   BMI 23.57 kg/m   General: 52 y.o. year-old male well developed well nourished in no acute distress.  Alert and oriented x3. HEENT: NCAT, EOMI Neck: Supple, trachea medial Cardiovascular: Regular rate and rhythm with no rubs or gallops.  No thyromegaly or JVD noted.  No lower extremity edema. 2/4 pulses in all 4 extremities. Respiratory: Clear to auscultation with no wheezes or rales. Good inspiratory  effort. Abdomen: Soft, nontender nondistended with normal bowel sounds x4 quadrants. Muskuloskeletal: Tender to palpation of the lumbar area.  No cyanosis, clubbing or edema noted bilaterally Neuro: CN II-XII intact,  sensation, reflexes intact Skin: No ulcerative lesions noted or rashes Psychiatry: Judgement and insight appear normal. Mood is appropriate for condition and setting           Labs on Admission:  Basic Metabolic Panel: Recent Labs  Lab 05/01/22 2118  NA 138  K 5.2*  CL 112*  CO2 19*  GLUCOSE 91  BUN 53*  CREATININE 6.41*  CALCIUM 8.5*   Liver Function Tests: No results for input(s): "AST", "ALT", "ALKPHOS", "BILITOT", "PROT", "ALBUMIN" in the last 168 hours. No results for input(s): "LIPASE", "AMYLASE" in the last 168 hours. No results for input(s): "AMMONIA" in the last 168 hours. CBC: Recent Labs  Lab 05/01/22 2118  WBC 7.0  HGB 10.9*  HCT 35.3*  MCV 86.7  PLT 477*   Cardiac Enzymes: No results for input(s): "CKTOTAL", "CKMB", "CKMBINDEX", "TROPONINI" in the last 168 hours.  BNP (last 3 results) No results for input(s): "BNP" in the last 8760 hours.  ProBNP (last 3 results) No results for input(s): "PROBNP" in the last 8760 hours.  CBG: No results for input(s): "GLUCAP" in the last 168 hours.  Radiological Exams on Admission: CT ABDOMEN PELVIS WO CONTRAST  Result Date: 05/01/2022 CLINICAL DATA:  LLQ abdominal pain. Lower back pain that radiates down both legs that's been going on for 2xweeks that has gotten worse. Pt states at times left flank hurts. EXAM: CT ABDOMEN AND PELVIS WITHOUT CONTRAST TECHNIQUE: Multidetector CT imaging of the abdomen and pelvis was performed following the standard protocol without IV contrast. RADIATION DOSE REDUCTION: This exam was performed according to the departmental dose-optimization program which includes automated exposure control, adjustment of the mA and/or kV according to patient size and/or use of iterative reconstruction technique. COMPARISON:  CT abdomen pelvis 07/06/2017 FINDINGS: Lower chest: No acute abnormality. Hepatobiliary: No focal liver abnormality. Contracted gallbladder. No gallstones, gallbladder wall thickening, or pericholecystic fluid. No biliary dilatation. Pancreas: No focal lesion. Normal pancreatic contour. No surrounding inflammatory changes. No main pancreatic ductal dilatation.  Spleen: Normal in size without focal abnormality. Adrenals/Urinary Tract: No adrenal nodule bilaterally. No nephroureterolithiasis bilaterally. Severe bilateral hydronephrosis. Severe bilateral hydroureter. Circumferential urinary bladder wall thickening. Stomach/Bowel: Stomach is within normal limits. No evidence of bowel wall thickening or dilatation. Appendix appears normal. Vascular/Lymphatic: No abdominal aorta or iliac aneurysm. Mild atherosclerotic plaque of the aorta and its branches. No abdominal, pelvic, or inguinal lymphadenopathy. Reproductive: Prostate is unremarkable. Other: No intraperitoneal free fluid. No intraperitoneal free gas. No organized fluid collection. Musculoskeletal: No abdominal wall hernia or abnormality. No suspicious lytic or blastic osseous lesions. No acute displaced fracture. IMPRESSION: 1. Severe bilateral hydroureteronephrosis with associated urinary bladder wall thickening. Limited evaluation on this noncontrast study. This may reflect chronic changes of obstructive uropathy or reflux. Underlying malignancy not excluded suggest. Recommend urologic consultation. 2.  Aortic Atherosclerosis (ICD10-I70.0). Electronically Signed   By: Tish Frederickson M.D.   On: 05/01/2022 23:21    EKG: I independently viewed the EKG done and my findings are as followed: EKG was not done in the ED  Assessment/Plan Present on Admission:  Obstructive uropathy  Principal Problem:   AKI (acute kidney injury) (HCC) Active Problems:   Obstructive uropathy   Bladder spasm   Bilateral low back pain   Hydroureteronephrosis   Hyperkalemia   Essential hypertension  Acute kidney  injury possibly due to obstructive uropathy Bilateral hydroureteronephrosis BUN/creatinine 53/6.41 (creatinine about 1 year ago was 0.99). Continue gentle hydration Renally adjust medications, avoid nephrotoxic agents/dehydration/hypotension Continue Foley catheter Urology will be consulted and we shall await  further recommendations  Bladder spasm Continue oxybutynin as needed  Bilateral back pain Continue IV Dilaudid 0.5 mg every 4 hours as needed  Hyperkalemia K+ 5.2, continue IV hydration and recheck potassium  Essential hypertension Continue lisinopril   DVT prophylaxis: Heparin subcu   Code Status: Full code  Family Communication: Mother at bedside (all questions answered to satisfaction)  Consults: Urology   Severity of Illness: The appropriate patient status for this patient is INPATIENT. Inpatient status is judged to be reasonable and necessary in order to provide the required intensity of service to ensure the patient's safety. The patient's presenting symptoms, physical exam findings, and initial radiographic and laboratory data in the context of their chronic comorbidities is felt to place them at high risk for further clinical deterioration. Furthermore, it is not anticipated that the patient will be medically stable for discharge from the hospital within 2 midnights of admission.   * I certify that at the point of admission it is my clinical judgment that the patient will require inpatient hospital care spanning beyond 2 midnights from the point of admission due to high intensity of service, high risk for further deterioration and high frequency of surveillance required.*  Author: Bernadette Hoit, DO 05/02/2022 6:22 AM  For on call review [email or web address].

## 2022-05-02 NOTE — Progress Notes (Addendum)
PROGRESS NOTE    Wesley Sexton  PIR:[id number] DOB: 1970-12-01 DOA: 05/01/2022 PCP: [facility]   Brief Narrative:  HPI: Wesley Sexton is a 52 y.o. male with medical history significant of hypertension who presents to the emergency department due to bilateral low back pain.  Patient complained of chronic bilateral low back pain that has been ongoing for 1 year, but this really worsened within the last 2 weeks, he denies any fall, trauma, lifting weights.  Pain was tight and aching and rated as 9/10 on pain scale, there was no radiation to the lower extremities, pain was exacerbated with extension and rotation of the spine and alleviated with flexion..  Patient also complained of 8 to 71-month onset of nocturnal enuresis without any urinary incontinence or fecal incontinence during the day, he has followed up with his PCP regarding this, but yet still follow-up with a specialist.  Patient complains of having to strain to urinate during the day, but denies fever, urinary frequency, urgency or painful urination.   ED Course:  In the emergency department, BP was 157/102, but other vital signs were within normal range.  Workup in the ED showed normocytic anemia and platelets of 477, BMP showed sodium 138, potassium 5.2, chloride 112, bicarb 19, BUN/creatinine 53/6.41 (creatinine about 1 year ago was 0.99).  Urinalysis was positive for large leukocytes but rare bacteria. CT abdomen and pelvis without contrast showed severe bilateral hydroureteronephrosis with associated urinary bladder wall thickening. Limited evaluation on this noncontrast study. This may reflect chronic changes of obstructive uropathy or reflux. Underlying malignancy not excluded Patient was treated with IV ceftriaxone, hydrocortisone cream was given, IV Dilaudid was provided.  Foley catheter was inserted with hematuria being noted in the Foley catheter.  Patient continues to complain of straining despite Foley  catheterization.    Assessment & Plan:   Principal Problem:   AKI (acute kidney injury) ([place]) Active Problems:   Obstructive uropathy   Bladder spasm   Bilateral low back pain   Hydroureteronephrosis   Hyperkalemia   Essential hypertension  Acute kidney injury possibly due to obstructive uropathy / Bilateral hydroureteronephrosis/gross hematuria: BUN/creatinine 53/6.41 (creatinine about 1 year ago was 0.99). Continue gentle hydration, morning labs are still pending.  Urology consulted.  Bladder spasm Continue oxybutynin as needed   Bilateral back pain Continue IV Dilaudid 0.5 mg every 4 hours as needed   Hyperkalemia K+ 5.2, continue IV hydration and BMP pending today. Addendum 11:46 AM: Potassium 6.2.  I have ordered Kayexalate as well as insulin and dextrose combination.  Continue to monitor on telemetry.  Recheck potassium in 6 hours.   Hypertensive urgency: Patient's blood pressure has been as high as systolic 630 and diastolic 160.  Patient was supposed to be taking lisinopril but he says that he stopped taking it about last year.  At this point in time due to significantly elevated blood pressure, I will start him on hydralazine 50 mg 3 times daily as well as as needed IV hydralazine and amlodipine 5 mg p.o. daily and monitor.  DVT prophylaxis: heparin injection 5,000 Units Start: 05/02/22 1400 SCDs Start: 05/02/22 0618   Code Status: Full Code  Family Communication: Patient's mother present at bedside.  Plan of care discussed with patient in length and he/she verbalized understanding and agreed with it.  Status is: Inpatient Remains inpatient appropriate because: Significantly elevated creatinine with hematuria, he is going to require couple of days of hospitalization.   Estimated body mass index is  23.57 kg/m as calculated from the following:   Height as of this encounter: 5\' 8"  (1.727 m).   Weight as of this encounter: 70.3 kg.    Nutritional Assessment: Body  mass index is 23.57 kg/m.Marland Kitchen Seen by dietician.  I agree with the assessment and plan as outlined below: Nutrition Status:        . Skin Assessment: I have examined the patient's skin and I agree with the wound assessment as performed by the wound care RN as outlined below:    Consultants:  Urology  Procedures:  None  Antimicrobials:  Anti-infectives (From admission, onward)    Start     Dose/Rate Route Frequency Ordered Stop   05/01/22 2315  cefTRIAXone (ROCEPHIN) 1 g in sodium chloride 0.9 % 100 mL IVPB        1 g 200 mL/hr over 30 Minutes Intravenous  Once 05/01/22 2304 05/02/22 0116         Subjective: Patient seen and examined, still in the ED.  Mother at the bedside.  Patient states that other than low back pain, he has no other complaint.  Objective: Vitals:   05/02/22 0422 05/02/22 0704 05/02/22 0900 05/02/22 1019  BP: (!) 171/95 (!) 182/109 (!) 194/158   Pulse: 78 70 71   Resp: 20 20    Temp: 98 F (36.7 C)   97.9 F (36.6 C)  TempSrc: Oral   Oral  SpO2: 100% 100% 100%   Weight:      Height:        Intake/Output Summary (Last 24 hours) at 05/02/2022 1045 Last data filed at 05/02/2022 0430 Gross per 24 hour  Intake --  Output 1230 ml  Net -1230 ml   Filed Weights   05/01/22 2054  Weight: 70.3 kg    Examination:  General exam: Appears calm and comfortable  Respiratory system: Clear to auscultation. Respiratory effort normal. Cardiovascular system: S1 & S2 heard, RRR. No JVD, murmurs, rubs, gallops or clicks. No pedal edema. Gastrointestinal system: Abdomen is nondistended, soft and nontender. No organomegaly or masses felt. Normal bowel sounds heard. Central nervous system: Alert and oriented. No focal neurological deficits. Extremities: Symmetric 5 x 5 power. Skin: No rashes, lesions or ulcers Psychiatry: Judgement and insight appear normal. Mood & affect appropriate.    Data Reviewed: I have personally reviewed following labs and imaging  studies  CBC: Recent Labs  Lab 05/01/22 2118 05/02/22 0645  WBC 7.0 13.8*  HGB 10.9* 12.8*  HCT 35.3* 41.6  MCV 86.7 87.2  PLT 477* 245*   Basic Metabolic Panel: Recent Labs  Lab 05/01/22 2118 05/02/22 0645  NA 138  --   K 5.2*  --   CL 112*  --   CO2 19*  --   GLUCOSE 91  --   BUN 53*  --   CREATININE 6.41* 6.00*  CALCIUM 8.5*  --    GFR: Estimated Creatinine Clearance: 14.1 mL/min (A) (by C-G formula based on SCr of 6 mg/dL (H)). Liver Function Tests: No results for input(s): "AST", "ALT", "ALKPHOS", "BILITOT", "PROT", "ALBUMIN" in the last 168 hours. No results for input(s): "LIPASE", "AMYLASE" in the last 168 hours. No results for input(s): "AMMONIA" in the last 168 hours. Coagulation Profile: No results for input(s): "INR", "PROTIME" in the last 168 hours. Cardiac Enzymes: No results for input(s): "CKTOTAL", "CKMB", "CKMBINDEX", "TROPONINI" in the last 168 hours. BNP (last 3 results) No results for input(s): "PROBNP" in the last 8760 hours. HbA1C: No results for  input(s): "HGBA1C" in the last 72 hours. CBG: No results for input(s): "GLUCAP" in the last 168 hours. Lipid Profile: No results for input(s): "CHOL", "HDL", "LDLCALC", "TRIG", "CHOLHDL", "LDLDIRECT" in the last 72 hours. Thyroid Function Tests: No results for input(s): "TSH", "T4TOTAL", "FREET4", "T3FREE", "THYROIDAB" in the last 72 hours. Anemia Panel: No results for input(s): "VITAMINB12", "FOLATE", "FERRITIN", "TIBC", "IRON", "RETICCTPCT" in the last 72 hours. Sepsis Labs: No results for input(s): "PROCALCITON", "LATICACIDVEN" in the last 168 hours.  No results found for this or any previous visit (from the past 240 hour(s)).   Radiology Studies: CT ABDOMEN PELVIS WO CONTRAST  Result Date: 05/01/2022 CLINICAL DATA:  LLQ abdominal pain. Lower back pain that radiates down both legs that's been going on for 2xweeks that has gotten worse. Pt states at times left flank hurts. EXAM: CT ABDOMEN AND  PELVIS WITHOUT CONTRAST TECHNIQUE: Multidetector CT imaging of the abdomen and pelvis was performed following the standard protocol without IV contrast. RADIATION DOSE REDUCTION: This exam was performed according to the departmental dose-optimization program which includes automated exposure control, adjustment of the mA and/or kV according to patient size and/or use of iterative reconstruction technique. COMPARISON:  CT abdomen pelvis 07/06/2017 FINDINGS: Lower chest: No acute abnormality. Hepatobiliary: No focal liver abnormality. Contracted gallbladder. No gallstones, gallbladder wall thickening, or pericholecystic fluid. No biliary dilatation. Pancreas: No focal lesion. Normal pancreatic contour. No surrounding inflammatory changes. No main pancreatic ductal dilatation. Spleen: Normal in size without focal abnormality. Adrenals/Urinary Tract: No adrenal nodule bilaterally. No nephroureterolithiasis bilaterally. Severe bilateral hydronephrosis. Severe bilateral hydroureter. Circumferential urinary bladder wall thickening. Stomach/Bowel: Stomach is within normal limits. No evidence of bowel wall thickening or dilatation. Appendix appears normal. Vascular/Lymphatic: No abdominal aorta or iliac aneurysm. Mild atherosclerotic plaque of the aorta and its branches. No abdominal, pelvic, or inguinal lymphadenopathy. Reproductive: Prostate is unremarkable. Other: No intraperitoneal free fluid. No intraperitoneal free gas. No organized fluid collection. Musculoskeletal: No abdominal wall hernia or abnormality. No suspicious lytic or blastic osseous lesions. No acute displaced fracture. IMPRESSION: 1. Severe bilateral hydroureteronephrosis with associated urinary bladder wall thickening. Limited evaluation on this noncontrast study. This may reflect chronic changes of obstructive uropathy or reflux. Underlying malignancy not excluded suggest. Recommend urologic consultation. 2.  Aortic Atherosclerosis (ICD10-I70.0).  Electronically Signed   By: Tish Frederickson M.D.   On: 05/01/2022 23:21    Scheduled Meds:  amLODipine  5 mg Oral Daily   heparin  5,000 Units Subcutaneous Q8H   hydrALAZINE  50 mg Oral Q8H   Continuous Infusions:  sodium chloride 100 mL/hr at 05/02/22 1017     LOS: 0 days   Hughie Closs, MD [facility]  05/02/2022, 10:45 AM   Total time spent 27 minutes *Please note that this is a verbal dictation therefore any spelling or grammatical errors are due to the "[employer] One" system interpretation.  Please page via Amion and do not message via secure chat for urgent patient care matters. Secure chat can be used for non urgent patient care matters.  How to contact the [facility] Attending or Consulting provider 7A - 7P or covering provider during after hours 7P -7A, for this patient?  Check the care team in [facility] and look for a) attending/consulting [facility] provider listed and b) the [facility] team listed. Page or secure chat 7A-7P. Log into [email or web address] and use [facility]'s universal password to access. If you do not have the password, please contact the hospital operator. Locate the [facility] provider you are looking for  under [facility] and page to a number that you can be directly reached. If you still have difficulty reaching the provider, please page the [facility] (Director on Call) for the Hospitalists listed on amion for assistance.

## 2022-05-02 NOTE — Consult Note (Signed)
Urology Consult  Referring physician: Dr. Thomes Dinning Reason for referral: Urinary retention and bilateral hydronephrosis  Chief Complaint: Difficulty urinating  History of Present Illness: Wesley Sexton is a 51yo with a history of hypertension who presented to the ER with an inability to urinate. Over the past week he has noted a weaker urinary stream, straining to urinate, urinary frequency and urgency. He denies any dysuria of history of gross hematuria. He notes he had longstanding difficulty urinating for years but did not seek medical attention. On presentation he had a creatinine of 6.4 from a baseline of 0.9. CT on admission showed a thick bladder wall and bilateral moderate hydronephrosis. A foley was placed and 1.2L drained. He developed hematuria initially but his urine is currently light pink. He denies nay prior BPH medication. He denies any pelvic pain. UA showed leukocytes and rare bacteria  Past Medical History:  Diagnosis Date   Hypertension    Pancreatitis    Past Surgical History:  Procedure Laterality Date   "pin hole" in heart     LEG SURGERY      Medications: I have reviewed the patient's current medications. Allergies:  Allergies  Allergen Reactions   Shellfish Allergy     History reviewed. No pertinent family history. Social History:  reports that he has been smoking cigarettes. He has been smoking an average of .5 packs per day. He has never used smokeless tobacco. He reports current alcohol use. He reports current drug use. Drugs: Cocaine and Marijuana.  Review of Systems  Genitourinary:  Positive for difficulty urinating, frequency, hematuria and urgency.  All other systems reviewed and are negative.   Physical Exam:  Vital signs in last 24 hours: Temp:  [97.9 F (36.6 C)-99 F (37.2 C)] 98.5 F (36.9 C) (01/15 1755) Pulse Rate:  [70-100] 87 (01/15 1755) Resp:  [11-22] 19 (01/15 1755) BP: (147-194)/(95-158) 179/109 (01/15 1755) SpO2:  [98 %-100 %] 100 %  (01/15 1755) Weight:  [68.1 kg-70.3 kg] 68.1 kg (01/15 1755) Physical Exam Vitals reviewed.  Constitutional:      Appearance: Normal appearance.  HENT:     Head: Normocephalic and atraumatic.     Nose: Nose normal.     Mouth/Throat:     Mouth: Mucous membranes are moist.  Eyes:     Extraocular Movements: Extraocular movements intact.     Pupils: Pupils are equal, round, and reactive to light.  Cardiovascular:     Rate and Rhythm: Normal rate and regular rhythm.  Pulmonary:     Effort: Pulmonary effort is normal. No respiratory distress.  Abdominal:     General: Abdomen is flat. There is no distension.  Genitourinary:    Penis: Normal.      Testes: Normal.  Musculoskeletal:        General: No swelling. Normal range of motion.     Cervical back: Normal range of motion and neck supple.  Skin:    General: Skin is warm and dry.  Neurological:     General: No focal deficit present.     Mental Status: He is alert and oriented to person, place, and time.  Psychiatric:        Mood and Affect: Mood normal.        Behavior: Behavior normal.        Thought Content: Thought content normal.        Judgment: Judgment normal.     Laboratory Data:  Results for orders placed or performed during the hospital encounter of 05/01/22 (  from the past 72 hour(s))  Basic metabolic panel     Status: Abnormal   Collection Time: 05/01/22  9:18 PM  Result Value Ref Range   Sodium 138 135 - 145 mmol/L   Potassium 5.2 (H) 3.5 - 5.1 mmol/L   Chloride 112 (H) 98 - 111 mmol/L   CO2 19 (L) 22 - 32 mmol/L   Glucose, Bld 91 70 - 99 mg/dL    Comment: Glucose reference range applies only to samples taken after fasting for at least 8 hours.   BUN 53 (H) 6 - 20 mg/dL   Creatinine, Ser 6.41 (H) 0.61 - 1.24 mg/dL   Calcium 8.5 (L) 8.9 - 10.3 mg/dL   GFR, Estimated 10 (L) >60 mL/min    Comment: (NOTE) Calculated using the CKD-EPI Creatinine Equation (2021)    Anion gap 7 5 - 15    Comment: Performed at  [facility], [address] [place], [place] [address]  CBC     Status: Abnormal   Collection Time: 05/01/22  9:18 PM  Result Value Ref Range   WBC 7.0 4.0 - 10.5 K/uL   RBC 4.07 (L) 4.22 - 5.81 MIL/uL   Hemoglobin 10.9 (L) 13.0 - 17.0 g/dL   HCT 35.3 (L) 39.0 - 52.0 %   MCV 86.7 80.0 - 100.0 fL   MCH 26.8 26.0 - 34.0 pg   MCHC 30.9 30.0 - 36.0 g/dL   RDW 14.4 11.5 - 15.5 %   Platelets 477 (H) 150 - 400 K/uL   nRBC 0.0 0.0 - 0.2 %    Comment: Performed at [facility], [address], [place], [place] [address]  Urinalysis, Routine w reflex microscopic     Status: Abnormal   Collection Time: 05/01/22  9:49 PM  Result Value Ref Range   Color, Urine YELLOW YELLOW   APPearance CLOUDY (A) CLEAR   Specific Gravity, Urine 1.008 1.005 - 1.030   pH 6.0 5.0 - 8.0   Glucose, UA NEGATIVE NEGATIVE mg/dL   Hgb urine dipstick SMALL (A) NEGATIVE   Bilirubin Urine NEGATIVE NEGATIVE   Ketones, ur NEGATIVE NEGATIVE mg/dL   Protein, ur 30 (A) NEGATIVE mg/dL   Nitrite NEGATIVE NEGATIVE   Leukocytes,Ua LARGE (A) NEGATIVE   RBC / HPF 6-10 0 - 5 RBC/hpf   WBC, UA >50 (H) 0 - 5 WBC/hpf   Bacteria, UA RARE (A) NONE SEEN   Squamous Epithelial / HPF 0-5 0 - 5 /HPF   WBC Clumps PRESENT    Mucus PRESENT    Budding Yeast PRESENT     Comment: Performed at [facility], [address] [place], [place] [address]  HIV Antibody (routine testing w rflx)     Status: None   Collection Time: 05/02/22  6:45 AM  Result Value Ref Range   HIV Screen 4th Generation wRfx Non Reactive Non Reactive    Comment: Performed at [facility] [address] [place], [place] [address]  CBC     Status: Abnormal   Collection Time: 05/02/22  6:45 AM  Result Value Ref Range   WBC 13.8 (H) 4.0 - 10.5 K/uL   RBC 4.77 4.22 - 5.81 MIL/uL   Hemoglobin 12.8 (L) 13.0 - 17.0 g/dL   HCT 41.6 39.0 - 52.0 %   MCV 87.2 80.0 - 100.0 fL   MCH 26.8 26.0 - 34.0 pg   MCHC 30.8 30.0 - 36.0 g/dL   RDW 14.6 11.5 - 15.5 %   Platelets  610 (H) 150 - 400 K/uL   nRBC 0.0 0.0 - 0.2 %    Comment: Performed at [facility], [address], [place], [place] [address]  Creatinine, serum     Status: Abnormal   Collection Time: 05/02/22  6:45 AM  Result Value Ref Range   Creatinine, Ser 6.00 (H) 0.61 - 1.24 mg/dL   GFR, Estimated 11 (L) >60 mL/min    Comment: (NOTE) Calculated using the CKD-EPI Creatinine Equation (2021) Performed at [facility], [address], [place], [place] [address]   Basic metabolic panel     Status: Abnormal   Collection Time: 05/02/22 10:36 AM  Result Value Ref Range   Sodium 133 (L) 135 - 145 mmol/L   Potassium 6.4 (HH) 3.5 - 5.1 mmol/L    Comment: CRITICAL RESULT CALLED TO, READ BACK BY AND VERIFIED WITH: CUGINO,M AT 11:35AM ON 05/02/22 BY FESTERMAN,C    Chloride 105 98 - 111 mmol/L   CO2 16 (L) 22 - 32 mmol/L   Glucose, Bld 139 (H) 70 - 99 mg/dL    Comment: Glucose reference range applies only to samples taken after fasting for at least 8 hours.   BUN 49 (H) 6 - 20 mg/dL   Creatinine, Ser 5.80 (H) 0.61 - 1.24 mg/dL   Calcium 8.8 (L) 8.9 - 10.3 mg/dL   GFR, Estimated 11 (L) >60 mL/min    Comment: (NOTE) Calculated using the CKD-EPI Creatinine Equation (2021)    Anion gap 12 5 - 15    Comment: Performed at [facility], [address], [place], [place] [address]  Potassium     Status: Abnormal   Collection Time: 05/02/22  6:00 PM  Result Value Ref Range   Potassium 5.2 (H) 3.5 - 5.1 mmol/L    Comment: DELTA CHECK NOTED Performed at [facility], [address], [place], [place] [address]    No results found for this or any previous visit (from the past 240 hour(s)). Creatinine: Recent Labs    05/01/22 2118 05/02/22 0645 05/02/22 1036  CREATININE 6.41* 6.00* 5.80*   Baseline Creatinine: 0.9  Impression/Assessment:  51yo with urinary retention, gross hematuria and bilateral hydronephrosis  Plan:  Urinary retention: We discussed the management of his BPH including medical therapy,  Rezum, Urolift, TURP and simple prostatectomy. After discussing the options the patient has elected to proceed with medical therapy.  Please continue indwelling foley and please start flomax 0.4mg  daily. The patient will followup in my office in 2 weeks for a voiding trial Gross hematuria and bilateral hydronephrosis: I discussed the natural history of hydronephrosis and gross hematuria and the various causes of hydronephrosis. His hydronephrosis is likely related to bladder outlet obstruction but he will require non urgent cystoscopy to evaluate for possible malignancy. The hematuria is likely related to rapid decompression of the bladder following foley placement and should resolve in the next 48 hours.     Nicolette Bang 05/02/2022, 7:38 PM

## 2022-05-02 NOTE — ED Notes (Signed)
Pt requesting pain medication. Secure message sent to provider

## 2022-05-03 ENCOUNTER — Inpatient Hospital Stay (HOSPITAL_COMMUNITY): Payer: Medicaid Other

## 2022-05-03 DIAGNOSIS — R310 Gross hematuria: Secondary | ICD-10-CM | POA: Diagnosis not present

## 2022-05-03 DIAGNOSIS — R339 Retention of urine, unspecified: Secondary | ICD-10-CM | POA: Diagnosis not present

## 2022-05-03 DIAGNOSIS — N1330 Unspecified hydronephrosis: Secondary | ICD-10-CM | POA: Diagnosis not present

## 2022-05-03 DIAGNOSIS — N179 Acute kidney failure, unspecified: Secondary | ICD-10-CM | POA: Diagnosis not present

## 2022-05-03 LAB — CBC
HCT: 39.5 % (ref 39.0–52.0)
Hemoglobin: 12.6 g/dL — ABNORMAL LOW (ref 13.0–17.0)
MCH: 27.2 pg (ref 26.0–34.0)
MCHC: 31.9 g/dL (ref 30.0–36.0)
MCV: 85.3 fL (ref 80.0–100.0)
Platelets: 599 10*3/uL — ABNORMAL HIGH (ref 150–400)
RBC: 4.63 MIL/uL (ref 4.22–5.81)
RDW: 14.5 % (ref 11.5–15.5)
WBC: 14.3 10*3/uL — ABNORMAL HIGH (ref 4.0–10.5)
nRBC: 0 % (ref 0.0–0.2)

## 2022-05-03 LAB — MAGNESIUM: Magnesium: 1.4 mg/dL — ABNORMAL LOW (ref 1.7–2.4)

## 2022-05-03 LAB — COMPREHENSIVE METABOLIC PANEL
ALT: 18 U/L (ref 0–44)
AST: 25 U/L (ref 15–41)
Albumin: 3.5 g/dL (ref 3.5–5.0)
Alkaline Phosphatase: 95 U/L (ref 38–126)
Anion gap: 12 (ref 5–15)
BUN: 43 mg/dL — ABNORMAL HIGH (ref 6–20)
CO2: 15 mmol/L — ABNORMAL LOW (ref 22–32)
Calcium: 8.7 mg/dL — ABNORMAL LOW (ref 8.9–10.3)
Chloride: 108 mmol/L (ref 98–111)
Creatinine, Ser: 4.77 mg/dL — ABNORMAL HIGH (ref 0.61–1.24)
GFR, Estimated: 14 mL/min — ABNORMAL LOW (ref >60)
Glucose, Bld: 113 mg/dL — ABNORMAL HIGH (ref 70–99)
Potassium: 5.1 mmol/L (ref 3.5–5.1)
Sodium: 135 mmol/L (ref 135–145)
Total Bilirubin: 0.3 mg/dL (ref 0.3–1.2)
Total Protein: 7.7 g/dL (ref 6.5–8.1)

## 2022-05-03 LAB — PHOSPHORUS: Phosphorus: 4.4 mg/dL (ref 2.5–4.6)

## 2022-05-03 MED ORDER — MAGNESIUM SULFATE 4 GM/100ML IV SOLN
4.0000 g | Freq: Once | INTRAVENOUS | Status: AC
  Administered 2022-05-03: 4 g via INTRAVENOUS
  Filled 2022-05-03: qty 100

## 2022-05-03 MED ORDER — CHLORHEXIDINE GLUCONATE CLOTH 2 % EX PADS
6.0000 | MEDICATED_PAD | Freq: Every day | CUTANEOUS | Status: DC
  Administered 2022-05-03 – 2022-05-06 (×4): 6 via TOPICAL

## 2022-05-03 MED ORDER — TAMSULOSIN HCL 0.4 MG PO CAPS
0.4000 mg | ORAL_CAPSULE | Freq: Every day | ORAL | Status: DC
  Administered 2022-05-03 – 2022-05-06 (×4): 0.4 mg via ORAL
  Filled 2022-05-03 (×4): qty 1

## 2022-05-03 MED ORDER — HYDRALAZINE HCL 25 MG PO TABS
100.0000 mg | ORAL_TABLET | Freq: Three times a day (TID) | ORAL | Status: DC
  Administered 2022-05-03 – 2022-05-06 (×10): 100 mg via ORAL
  Filled 2022-05-03 (×11): qty 4

## 2022-05-03 NOTE — Progress Notes (Signed)
Subjective: Patient reports mild abdominal pain. Urine light pink. 4L urine output in the past 24 hours. Creatinine improved to 4.77  Objective: Vital signs in last 24 hours: Temp:  [97.7 F (36.5 C)-99 F (37.2 C)] 98.8 F (37.1 C) (01/16 0437) Pulse Rate:  [78-101] 101 (01/16 0437) Resp:  [11-20] 20 (01/16 0437) BP: (147-179)/(91-114) 172/91 (01/16 0437) SpO2:  [98 %-100 %] 98 % (01/16 0437) Weight:  [68.1 kg] 68.1 kg (01/15 1755)  Intake/Output from previous day: 01/15 0701 - 01/16 0700 In: 2312.1 [P.O.:720; I.V.:1592.1] Out: 4150 [Urine:4150] Intake/Output this shift: Total I/O In: 240 [P.O.:240] Out: -   Physical Exam:  General:alert, cooperative, and appears stated age GI: soft, non tender, normal bowel sounds, no palpable masses, no organomegaly, no inguinal hernia Male genitalia: not done Extremities: extremities normal, atraumatic, no cyanosis or edema  Lab Results: Recent Labs    05/01/22 2118 05/02/22 0645 05/03/22 0431  HGB 10.9* 12.8* 12.6*  HCT 35.3* 41.6 39.5   BMET Recent Labs    05/02/22 1036 05/02/22 1800 05/03/22 0431  NA 133*  --  135  K 6.4* 5.2* 5.1  CL 105  --  108  CO2 16*  --  15*  GLUCOSE 139*  --  113*  BUN 49*  --  43*  CREATININE 5.80*  --  4.77*  CALCIUM 8.8*  --  8.7*   No results for input(s): "LABPT", "INR" in the last 72 hours. No results for input(s): "LABURIN" in the last 72 hours. No results found for this or any previous visit.  Studies/Results: CT LUMBAR SPINE WO CONTRAST  Result Date: 05/03/2022 CLINICAL DATA:  Chronic low back pain.  No injury or prior surgery. EXAM: CT LUMBAR SPINE WITHOUT CONTRAST TECHNIQUE: Multidetector CT imaging of the lumbar spine was performed without intravenous contrast administration. Multiplanar CT image reconstructions were also generated. RADIATION DOSE REDUCTION: This exam was performed according to the departmental dose-optimization program which includes automated exposure  control, adjustment of the mA and/or kV according to patient size and/or use of iterative reconstruction technique. COMPARISON:  CT abdomen pelvis dated May 01, 2022. FINDINGS: Segmentation: 5 lumbar type vertebrae. Alignment: Straightening of the normal lumbar lordosis. No significant listhesis. Vertebrae: No acute fracture or focal pathologic process. Paraspinal and other soft tissues: Unchanged severe bilateral hydroureteronephrosis. Aortoiliac atherosclerotic vascular disease. Disc levels: T11-T12 to L1-L2: No significant disc bulge or herniation. No stenosis. L2-L3:  Mild disc bulging.  No stenosis. L3-L4: Mild disc bulging eccentric to the left. Mild left lateral recess and neuroforaminal stenosis. No spinal canal or right neuroforaminal stenosis. L4-L5:  Mild disc bulging.  No stenosis. L5-S1:  Mild disc bulging.  No stenosis. IMPRESSION: 1. No acute osseous abnormality. 2. Mild lumbar spondylosis as described above. No high-grade stenosis. 3. Unchanged severe bilateral hydroureteronephrosis. 4.  Aortic Atherosclerosis (ICD10-I70.0). Electronically Signed   By: Titus Dubin M.D.   On: 05/03/2022 10:14   CT ABDOMEN PELVIS WO CONTRAST  Result Date: 05/01/2022 CLINICAL DATA:  LLQ abdominal pain. Lower back pain that radiates down both legs that's been going on for 2xweeks that has gotten worse. Pt states at times left flank hurts. EXAM: CT ABDOMEN AND PELVIS WITHOUT CONTRAST TECHNIQUE: Multidetector CT imaging of the abdomen and pelvis was performed following the standard protocol without IV contrast. RADIATION DOSE REDUCTION: This exam was performed according to the departmental dose-optimization program which includes automated exposure control, adjustment of the mA and/or kV according to patient size and/or use of iterative reconstruction technique.  COMPARISON:  CT abdomen pelvis 07/06/2017 FINDINGS: Lower chest: No acute abnormality. Hepatobiliary: No focal liver abnormality. Contracted  gallbladder. No gallstones, gallbladder wall thickening, or pericholecystic fluid. No biliary dilatation. Pancreas: No focal lesion. Normal pancreatic contour. No surrounding inflammatory changes. No main pancreatic ductal dilatation. Spleen: Normal in size without focal abnormality. Adrenals/Urinary Tract: No adrenal nodule bilaterally. No nephroureterolithiasis bilaterally. Severe bilateral hydronephrosis. Severe bilateral hydroureter. Circumferential urinary bladder wall thickening. Stomach/Bowel: Stomach is within normal limits. No evidence of bowel wall thickening or dilatation. Appendix appears normal. Vascular/Lymphatic: No abdominal aorta or iliac aneurysm. Mild atherosclerotic plaque of the aorta and its branches. No abdominal, pelvic, or inguinal lymphadenopathy. Reproductive: Prostate is unremarkable. Other: No intraperitoneal free fluid. No intraperitoneal free gas. No organized fluid collection. Musculoskeletal: No abdominal wall hernia or abnormality. No suspicious lytic or blastic osseous lesions. No acute displaced fracture. IMPRESSION: 1. Severe bilateral hydroureteronephrosis with associated urinary bladder wall thickening. Limited evaluation on this noncontrast study. This may reflect chronic changes of obstructive uropathy or reflux. Underlying malignancy not excluded suggest. Recommend urologic consultation. 2.  Aortic Atherosclerosis (ICD10-I70.0). Electronically Signed   By: Iven Finn M.D.   On: 05/01/2022 23:21    Assessment/Plan: 52yo with urinary retention, gross hematuria, bilateral hydronephrosis  Please continue indwelling foley and please continue flomax 0.4mg  daily Hematuria is improving and his foley catheter is draining well. Urology to continue to follow   LOS: 1 day   Nicolette Bang 05/03/2022, 12:17 PM

## 2022-05-03 NOTE — Progress Notes (Signed)
Patient has been up and down to the bathroom all during this shift. Patient stated stools are formed and small amount each time he goes.

## 2022-05-03 NOTE — Progress Notes (Signed)
PROGRESS NOTE    Wesley Sexton  WFU:[id number] DOB: May 18, 1970 DOA: 05/01/2022 PCP: [facility]   Brief Narrative:  HPI: Wesley Sexton is a 51 y.o. male with medical history significant of hypertension who presents to the emergency department due to bilateral low back pain.  Patient complained of chronic bilateral low back pain that has been ongoing for 1 year, but this really worsened within the last 2 weeks, he denies any fall, trauma, lifting weights.  Pain was tight and aching and rated as 9/10 on pain scale, there was no radiation to the lower extremities, pain was exacerbated with extension and rotation of the spine and alleviated with flexion..  Patient also complained of 8 to 67-month onset of nocturnal enuresis without any urinary incontinence or fecal incontinence during the day, he has followed up with his PCP regarding this, but yet still follow-up with a specialist.  Patient complains of having to strain to urinate during the day, but denies fever, urinary frequency, urgency or painful urination.   ED Course:  In the emergency department, BP was 157/102, but other vital signs were within normal range.  Workup in the ED showed normocytic anemia and platelets of 477, BMP showed sodium 138, potassium 5.2, chloride 112, bicarb 19, BUN/creatinine 53/6.41 (creatinine about 1 year ago was 0.99).  Urinalysis was positive for large leukocytes but rare bacteria. CT abdomen and pelvis without contrast showed severe bilateral hydroureteronephrosis with associated urinary bladder wall thickening. Limited evaluation on this noncontrast study. This may reflect chronic changes of obstructive uropathy or reflux. Underlying malignancy not excluded Patient was treated with IV ceftriaxone, hydrocortisone cream was given, IV Dilaudid was provided.  Foley catheter was inserted with hematuria being noted in the Foley catheter.  Patient continues to complain of straining despite Foley  catheterization.    Assessment & Plan:   Principal Problem:   AKI (acute kidney injury) ([place]) Active Problems:   Obstructive uropathy   Bladder spasm   Bilateral low back pain   Hydronephrosis   Hyperkalemia   Essential hypertension   Urinary retention   Hypomagnesemia   Gross hematuria  Acute kidney injury possibly due to obstructive uropathy / Bilateral hydroureteronephrosis/gross hematuria/bladder outlet obstruction: BUN/creatinine 53/6.41 (creatinine about 1 year ago was 0.99).  Patient continues to have hematuria, slightly improved compared to yesterday.  Seen by urology, per them, the hematuria is likely secondary to rapid decompression of the bladder following Foley placement and that patient is going to require UroLift, TURP and simple prostatectomy but that will be done as an outpatient.  Creatinine slightly improved.  Monitor the course.  Urology mended Flomax but order was not placed.  I have ordered that today.  Bladder spasm Continue oxybutynin as needed  Hypomagnesemia: Will replace.   Bilateral back pain, acute on chronic Continue IV Dilaudid 0.5 mg every 4 hours as needed.  Since patient's only complaint is back pain, I will start with CT lumbar spine.   Hyperkalemia: Resolved.  Monitor closely.   Hypertensive urgency: Patient's blood pressure has been as high as systolic 202 and diastolic 542.  Patient was supposed to be taking lisinopril but he says that he stopped taking it about last year.  I started him on hydralazine 50 mg 3 times daily as well as as needed IV hydralazine and amlodipine 5 mg p.o. daily and monitor.  Blood pressure better but is still significantly elevated, will increase hydralazine dose to 100 mg 3 times daily and continue rest of  the management.  DVT prophylaxis: heparin injection 5,000 Units Start: 05/02/22 1400 SCDs Start: 05/02/22 0618   Code Status: Full Code  Family Communication: Patient's mother present at bedside.  Plan of care  discussed with patient in length and he/she verbalized understanding and agreed with it.  Status is: Inpatient Remains inpatient appropriate because: Significantly elevated creatinine with hematuria, he is going to require couple of days of hospitalization.   Estimated body mass index is 22.82 kg/m as calculated from the following:   Height as of this encounter: 5\' 8"  (1.727 m).   Weight as of this encounter: 68.1 kg.    Nutritional Assessment: Body mass index is 22.82 kg/m.Marland Kitchen Seen by dietician.  I agree with the assessment and plan as outlined below: Nutrition Status:        . Skin Assessment: I have examined the patient's skin and I agree with the wound assessment as performed by the wound care RN as outlined below:    Consultants:  Urology  Procedures:  None  Antimicrobials:  Anti-infectives (From admission, onward)    Start     Dose/Rate Route Frequency Ordered Stop   05/01/22 2315  cefTRIAXone (ROCEPHIN) 1 g in sodium chloride 0.9 % 100 mL IVPB        1 g 200 mL/hr over 30 Minutes Intravenous  Once 05/01/22 2304 05/02/22 0116         Subjective:  Patient seen and examined.  Complains of back pain.  No other complaint.  Objective: Vitals:   05/02/22 2038 05/03/22 0012 05/03/22 0135 05/03/22 0437  BP: (!) 166/110 (!) 163/101 (!) 156/96 (!) 172/91  Pulse: (!) 101  92 (!) 101  Resp: 18  18 20   Temp: 97.7 F (36.5 C)  97.9 F (36.6 C) 98.8 F (37.1 C)  TempSrc:   Oral   SpO2: 100%  100% 98%  Weight:      Height:        Intake/Output Summary (Last 24 hours) at 05/03/2022 0932 Last data filed at 05/03/2022 0610 Gross per 24 hour  Intake 2312.11 ml  Output 4150 ml  Net -1837.89 ml   Filed Weights   05/01/22 2054 05/02/22 1755  Weight: 70.3 kg 68.1 kg    Examination:  General exam: Appears calm and comfortable  Respiratory system: Clear to auscultation. Respiratory effort normal. Cardiovascular system: S1 & S2 heard, RRR. No JVD, murmurs, rubs,  gallops or clicks. No pedal edema. Gastrointestinal system: Abdomen is nondistended, soft and nontender. No organomegaly or masses felt. Normal bowel sounds heard. Central nervous system: Alert and oriented. No focal neurological deficits. Extremities: Symmetric 5 x 5 power. Skin: No rashes, lesions or ulcers.  Psychiatry: Judgement and insight appear normal. Mood & affect appropriate.   Data Reviewed: I have personally reviewed following labs and imaging studies  CBC: Recent Labs  Lab 05/01/22 2118 05/02/22 0645 05/03/22 0431  WBC 7.0 13.8* 14.3*  HGB 10.9* 12.8* 12.6*  HCT 35.3* 41.6 39.5  MCV 86.7 87.2 85.3  PLT 477* 610* [id number]*   Basic Metabolic Panel: Recent Labs  Lab 05/01/22 2118 05/02/22 0645 05/02/22 1036 05/02/22 1800 05/03/22 0431  NA 138  --  133*  --  135  K 5.2*  --  6.4* 5.2* 5.1  CL 112*  --  105  --  108  CO2 19*  --  16*  --  15*  GLUCOSE 91  --  139*  --  113*  BUN 53*  --  49*  --  43*  CREATININE 6.41* 6.00* 5.80*  --  4.77*  CALCIUM 8.5*  --  8.8*  --  8.7*  MG  --   --   --   --  1.4*  PHOS  --   --   --   --  4.4   GFR: Estimated Creatinine Clearance: 17.6 mL/min (A) (by C-G formula based on SCr of 4.77 mg/dL (H)). Liver Function Tests: Recent Labs  Lab 05/03/22 0431  AST 25  ALT 18  ALKPHOS 95  BILITOT 0.3  PROT 7.7  ALBUMIN 3.5   No results for input(s): "LIPASE", "AMYLASE" in the last 168 hours. No results for input(s): "AMMONIA" in the last 168 hours. Coagulation Profile: No results for input(s): "INR", "PROTIME" in the last 168 hours. Cardiac Enzymes: No results for input(s): "CKTOTAL", "CKMB", "CKMBINDEX", "TROPONINI" in the last 168 hours. BNP (last 3 results) No results for input(s): "PROBNP" in the last 8760 hours. HbA1C: No results for input(s): "HGBA1C" in the last 72 hours. CBG: No results for input(s): "GLUCAP" in the last 168 hours. Lipid Profile: No results for input(s): "CHOL", "HDL", "LDLCALC", "TRIG", "CHOLHDL",  "LDLDIRECT" in the last 72 hours. Thyroid Function Tests: No results for input(s): "TSH", "T4TOTAL", "FREET4", "T3FREE", "THYROIDAB" in the last 72 hours. Anemia Panel: No results for input(s): "VITAMINB12", "FOLATE", "FERRITIN", "TIBC", "IRON", "RETICCTPCT" in the last 72 hours. Sepsis Labs: No results for input(s): "PROCALCITON", "LATICACIDVEN" in the last 168 hours.  No results found for this or any previous visit (from the past 240 hour(s)).   Radiology Studies: CT ABDOMEN PELVIS WO CONTRAST  Result Date: 05/01/2022 CLINICAL DATA:  LLQ abdominal pain. Lower back pain that radiates down both legs that's been going on for 2xweeks that has gotten worse. Pt states at times left flank hurts. EXAM: CT ABDOMEN AND PELVIS WITHOUT CONTRAST TECHNIQUE: Multidetector CT imaging of the abdomen and pelvis was performed following the standard protocol without IV contrast. RADIATION DOSE REDUCTION: This exam was performed according to the departmental dose-optimization program which includes automated exposure control, adjustment of the mA and/or kV according to patient size and/or use of iterative reconstruction technique. COMPARISON:  CT abdomen pelvis 07/06/2017 FINDINGS: Lower chest: No acute abnormality. Hepatobiliary: No focal liver abnormality. Contracted gallbladder. No gallstones, gallbladder wall thickening, or pericholecystic fluid. No biliary dilatation. Pancreas: No focal lesion. Normal pancreatic contour. No surrounding inflammatory changes. No main pancreatic ductal dilatation. Spleen: Normal in size without focal abnormality. Adrenals/Urinary Tract: No adrenal nodule bilaterally. No nephroureterolithiasis bilaterally. Severe bilateral hydronephrosis. Severe bilateral hydroureter. Circumferential urinary bladder wall thickening. Stomach/Bowel: Stomach is within normal limits. No evidence of bowel wall thickening or dilatation. Appendix appears normal. Vascular/Lymphatic: No abdominal aorta or iliac  aneurysm. Mild atherosclerotic plaque of the aorta and its branches. No abdominal, pelvic, or inguinal lymphadenopathy. Reproductive: Prostate is unremarkable. Other: No intraperitoneal free fluid. No intraperitoneal free gas. No organized fluid collection. Musculoskeletal: No abdominal wall hernia or abnormality. No suspicious lytic or blastic osseous lesions. No acute displaced fracture. IMPRESSION: 1. Severe bilateral hydroureteronephrosis with associated urinary bladder wall thickening. Limited evaluation on this noncontrast study. This may reflect chronic changes of obstructive uropathy or reflux. Underlying malignancy not excluded suggest. Recommend urologic consultation. 2.  Aortic Atherosclerosis (ICD10-I70.0). Electronically Signed   By: Tish Frederickson M.D.   On: 05/01/2022 23:21    Scheduled Meds:  amLODipine  5 mg Oral Daily   Chlorhexidine Gluconate Cloth  6 each Topical Daily   heparin  5,000 Units Subcutaneous Q8H  hydrALAZINE  100 mg Oral Q8H   Continuous Infusions:  sodium chloride 100 mL/hr at 05/03/22 0656   magnesium sulfate bolus IVPB       LOS: 1 day   Darliss Cheney, MD [facility]  05/03/2022, 9:32 AM   Total time spent 27 minutes *Please note that this is a verbal dictation therefore any spelling or grammatical errors are due to the "[employer]" system interpretation.  Please page via [place] and do not message via secure chat for urgent patient care matters. Secure chat can be used for non urgent patient care matters.  How to contact the [facility] Attending or Consulting provider [place] or covering provider during after hours [place], for this patient?  Check the care team in [facility] and look for a) attending/consulting TRH provider listed and b) the [facility] team listed. Page or secure chat 7A-7P. Log into [email or web address] and use [place]'s universal password to access. If you do not have the password, please contact the hospital operator. Locate the [facility] provider  you are looking for under [facility] and page to a number that you can be directly reached. If you still have difficulty reaching the provider, please page the [facility] (Director on Call) for the Hospitalists listed on amion for assistance.

## 2022-05-03 NOTE — Progress Notes (Signed)
Patients heart rate rises into the 120's when her gets up to go to the bathroom.  At rest patient is tachy in the 100's. I asked patient if he uses tobacco products and he confirms but refused a nicotine patch at this time.

## 2022-05-04 DIAGNOSIS — N179 Acute kidney failure, unspecified: Secondary | ICD-10-CM | POA: Diagnosis not present

## 2022-05-04 LAB — CBC WITH DIFFERENTIAL/PLATELET
Abs Immature Granulocytes: 0.05 10*3/uL (ref 0.00–0.07)
Basophils Absolute: 0 10*3/uL (ref 0.0–0.1)
Basophils Relative: 0 %
Eosinophils Absolute: 0.1 10*3/uL (ref 0.0–0.5)
Eosinophils Relative: 1 %
HCT: 36.3 % — ABNORMAL LOW (ref 39.0–52.0)
Hemoglobin: 11.4 g/dL — ABNORMAL LOW (ref 13.0–17.0)
Immature Granulocytes: 1 %
Lymphocytes Relative: 11 %
Lymphs Abs: 1 10*3/uL (ref 0.7–4.0)
MCH: 26.9 pg (ref 26.0–34.0)
MCHC: 31.4 g/dL (ref 30.0–36.0)
MCV: 85.6 fL (ref 80.0–100.0)
Monocytes Absolute: 1.4 10*3/uL — ABNORMAL HIGH (ref 0.1–1.0)
Monocytes Relative: 14 %
Neutro Abs: 7.2 10*3/uL (ref 1.7–7.7)
Neutrophils Relative %: 73 %
Platelets: 512 10*3/uL — ABNORMAL HIGH (ref 150–400)
RBC: 4.24 MIL/uL (ref 4.22–5.81)
RDW: 14.6 % (ref 11.5–15.5)
WBC: 9.9 10*3/uL (ref 4.0–10.5)
nRBC: 0 % (ref 0.0–0.2)

## 2022-05-04 LAB — MAGNESIUM: Magnesium: 2.3 mg/dL (ref 1.7–2.4)

## 2022-05-04 LAB — BASIC METABOLIC PANEL
Anion gap: 8 (ref 5–15)
BUN: 36 mg/dL — ABNORMAL HIGH (ref 6–20)
CO2: 17 mmol/L — ABNORMAL LOW (ref 22–32)
Calcium: 8.5 mg/dL — ABNORMAL LOW (ref 8.9–10.3)
Chloride: 109 mmol/L (ref 98–111)
Creatinine, Ser: 4.21 mg/dL — ABNORMAL HIGH (ref 0.61–1.24)
GFR, Estimated: 16 mL/min — ABNORMAL LOW (ref >60)
Glucose, Bld: 146 mg/dL — ABNORMAL HIGH (ref 70–99)
Potassium: 4.8 mmol/L (ref 3.5–5.1)
Sodium: 134 mmol/L — ABNORMAL LOW (ref 135–145)

## 2022-05-04 NOTE — Progress Notes (Signed)
Patient continues to have Heart rate go from 119 at rest to 150-160's when ambulating to the restroom. Dr. Josephine Cables made aware no new orders. Patient stated a yellow mews this shift from elevated heart rate. Dilaudid given at 0332 for back pain. No further complaints. Continued to monitor.

## 2022-05-04 NOTE — Progress Notes (Signed)
PROGRESS NOTE    Silvano Garofano  NFA:[id number] DOB: 1970-12-28 DOA: 05/01/2022 PCP: [facility]   Brief Narrative:  HPI: Wesley Sexton is a 52 y.o. male with medical history significant of hypertension who presents to the emergency department due to bilateral low back pain.  Patient complained of chronic bilateral low back pain that has been ongoing for 1 year, but this really worsened within the last 2 weeks, he denies any fall, trauma, lifting weights.  Pain was tight and aching and rated as 9/10 on pain scale, there was no radiation to the lower extremities, pain was exacerbated with extension and rotation of the spine and alleviated with flexion..  Patient also complained of 8 to 72-month onset of nocturnal enuresis without any urinary incontinence or fecal incontinence during the day, he has followed up with his PCP regarding this, but yet still follow-up with a specialist.  Patient complains of having to strain to urinate during the day, but denies fever, urinary frequency, urgency or painful urination.   ED Course:  In the emergency department, BP was 157/102, but other vital signs were within normal range.  Workup in the ED showed normocytic anemia and platelets of 477, BMP showed sodium 138, potassium 5.2, chloride 112, bicarb 19, BUN/creatinine 53/6.41 (creatinine about 1 year ago was 0.99).  Urinalysis was positive for large leukocytes but rare bacteria. CT abdomen and pelvis without contrast showed severe bilateral hydroureteronephrosis with associated urinary bladder wall thickening. Limited evaluation on this noncontrast study. This may reflect chronic changes of obstructive uropathy or reflux. Underlying malignancy not excluded Patient was treated with IV ceftriaxone, hydrocortisone cream was given, IV Dilaudid was provided.  Foley catheter was inserted with hematuria being noted in the Foley catheter.  Patient continues to complain of straining despite Foley  catheterization.    Assessment & Plan:   Principal Problem:   AKI (acute kidney injury) ([place]) Active Problems:   Obstructive uropathy   Bladder spasm   Bilateral low back pain   Hydronephrosis   Hyperkalemia   Essential hypertension   Urinary retention   Hypomagnesemia   Gross hematuria  Acute kidney injury possibly due to obstructive uropathy / Bilateral hydroureteronephrosis/gross hematuria/bladder outlet obstruction: BUN/creatinine 53/6.41 (creatinine about 1 year ago was 0.99).  Patient continues to have hematuria, slightly improved compared to yesterday.  Seen by urology, per them, the hematuria is likely secondary to rapid decompression of the bladder following Foley placement and that patient is going to require UroLift, TURP and simple prostatectomy but that will be done as an outpatient.  Creatinine slightly improved.  Continue Flomax.  Urology following.  Bladder spasm Continue oxybutynin as needed  Hypomagnesemia: Replaced and resolved.   Bilateral back pain, acute on chronic: CT lumbar spine shows mild lumbar spondylosis, no high-grade stenosis. Continue IV Dilaudid 0.5 mg every 4 hours as needed.     Hyperkalemia: Resolved.  Monitor closely.   Hypertensive urgency: Patient's blood pressure has been as high as systolic 469 and diastolic 629.  Patient was supposed to be taking lisinopril but he says that he stopped taking it about last year.  I started him on hydralazine 50 mg 3 times daily as well as as needed IV hydralazine and amlodipine 5 mg p.o. daily and monitor.  Due to elevated blood pressure, hydralazine was increased to 100 mg 3 times daily and currently blood pressure is very well-controlled.   DVT prophylaxis: heparin injection 5,000 Units Start: 05/02/22 1400 SCDs Start: 05/02/22 5284  Code Status: Full Code  Family Communication: none present at bedside.  Plan of care discussed with patient in length and he/she verbalized understanding and agreed with  it.  Status is: Inpatient Remains inpatient appropriate because: Significantly elevated creatinine with hematuria, he is going to require couple of days of hospitalization.   Estimated body mass index is 22.82 kg/m as calculated from the following:   Height as of this encounter: 5\' 8"  (1.727 m).   Weight as of this encounter: 68.1 kg.    Nutritional Assessment: Body mass index is 22.82 kg/m. Seen by dietician.  I agree with the assessment and plan as outlined below: Nutrition Status:        . Skin Assessment: I have examined the patient's skin and I agree with the wound assessment as performed by the wound care RN as outlined below:    Consultants:  Urology  Procedures:  None  Antimicrobials:  Anti-infectives (From admission, onward)    Start     Dose/Rate Route Frequency Ordered Stop   05/01/22 2315  cefTRIAXone (ROCEPHIN) 1 g in sodium chloride 0.9 % 100 mL IVPB        1 g 200 mL/hr over 30 Minutes Intravenous  Once 05/01/22 2304 05/02/22 0116         Subjective:  Patient seen and examined.  No new complaint.  Back pain is improving.  Objective: Vitals:   05/03/22 2055 05/03/22 2253 05/04/22 0353 05/04/22 0800  BP: 139/87 121/69 (!) 143/85 129/69  Pulse: (!) 121 (!) 115 (!) 109   Resp: 16 17 14    Temp: 98.3 F (36.8 C) 98.6 F (37 C) 98.1 F (36.7 C)   TempSrc: Oral Oral Oral   SpO2: 100% 100% 100%   Weight:      Height:        Intake/Output Summary (Last 24 hours) at 05/04/2022 0938 Last data filed at 05/04/2022 0234 Gross per 24 hour  Intake 887.33 ml  Output 1500 ml  Net -612.67 ml    Filed Weights   05/01/22 2054 05/02/22 1755  Weight: 70.3 kg 68.1 kg    Examination:  General exam: Appears calm and comfortable  Respiratory system: Clear to auscultation. Respiratory effort normal. Cardiovascular system: S1 & S2 heard, RRR. No JVD, murmurs, rubs, gallops or clicks. No pedal edema. Gastrointestinal system: Abdomen is nondistended,  soft and nontender. No organomegaly or masses felt. Normal bowel sounds heard. Central nervous system: Alert and oriented. No focal neurological deficits. Extremities: Symmetric 5 x 5 power. Skin: No rashes, lesions or ulcers.  Psychiatry: Judgement and insight appear normal. Mood & affect appropriate.   Data Reviewed: I have personally reviewed following labs and imaging studies  CBC: Recent Labs  Lab 05/01/22 2118 05/02/22 0645 05/03/22 0431 05/04/22 0326  WBC 7.0 13.8* 14.3* 9.9  NEUTROABS  --   --   --  7.2  HGB 10.9* 12.8* 12.6* 11.4*  HCT 35.3* 41.6 39.5 36.3*  MCV 86.7 87.2 85.3 85.6  PLT 477* 610* 599* 512*    Basic Metabolic Panel: Recent Labs  Lab 05/01/22 2118 05/02/22 0645 05/02/22 1036 05/02/22 1800 05/03/22 0431 05/04/22 0326  NA 138  --  133*  --  135 134*  K 5.2*  --  6.4* 5.2* 5.1 4.8  CL 112*  --  105  --  108 109  CO2 19*  --  16*  --  15* 17*  GLUCOSE 91  --  139*  --  113* 146*  BUN  53*  --  49*  --  43* 36*  CREATININE 6.41* 6.00* 5.80*  --  4.77* 4.21*  CALCIUM 8.5*  --  8.8*  --  8.7* 8.5*  MG  --   --   --   --  1.4* 2.3  PHOS  --   --   --   --  4.4  --     GFR: Estimated Creatinine Clearance: 20 mL/min (A) (by C-G formula based on SCr of 4.21 mg/dL (H)). Liver Function Tests: Recent Labs  Lab 05/03/22 0431  AST 25  ALT 18  ALKPHOS 95  BILITOT 0.3  PROT 7.7  ALBUMIN 3.5    No results for input(s): "LIPASE", "AMYLASE" in the last 168 hours. No results for input(s): "AMMONIA" in the last 168 hours. Coagulation Profile: No results for input(s): "INR", "PROTIME" in the last 168 hours. Cardiac Enzymes: No results for input(s): "CKTOTAL", "CKMB", "CKMBINDEX", "TROPONINI" in the last 168 hours. BNP (last 3 results) No results for input(s): "PROBNP" in the last 8760 hours. HbA1C: No results for input(s): "HGBA1C" in the last 72 hours. CBG: No results for input(s): "GLUCAP" in the last 168 hours. Lipid Profile: No results for  input(s): "CHOL", "HDL", "LDLCALC", "TRIG", "CHOLHDL", "LDLDIRECT" in the last 72 hours. Thyroid Function Tests: No results for input(s): "TSH", "T4TOTAL", "FREET4", "T3FREE", "THYROIDAB" in the last 72 hours. Anemia Panel: No results for input(s): "VITAMINB12", "FOLATE", "FERRITIN", "TIBC", "IRON", "RETICCTPCT" in the last 72 hours. Sepsis Labs: No results for input(s): "PROCALCITON", "LATICACIDVEN" in the last 168 hours.  No results found for this or any previous visit (from the past 240 hour(s)).   Radiology Studies: CT LUMBAR SPINE WO CONTRAST  Result Date: 05/03/2022 CLINICAL DATA:  Chronic low back pain.  No injury or prior surgery. EXAM: CT LUMBAR SPINE WITHOUT CONTRAST TECHNIQUE: Multidetector CT imaging of the lumbar spine was performed without intravenous contrast administration. Multiplanar CT image reconstructions were also generated. RADIATION DOSE REDUCTION: This exam was performed according to the departmental dose-optimization program which includes automated exposure control, adjustment of the mA and/or kV according to patient size and/or use of iterative reconstruction technique. COMPARISON:  CT abdomen pelvis dated May 01, 2022. FINDINGS: Segmentation: 5 lumbar type vertebrae. Alignment: Straightening of the normal lumbar lordosis. No significant listhesis. Vertebrae: No acute fracture or focal pathologic process. Paraspinal and other soft tissues: Unchanged severe bilateral hydroureteronephrosis. Aortoiliac atherosclerotic vascular disease. Disc levels: T11-T12 to L1-L2: No significant disc bulge or herniation. No stenosis. L2-L3:  Mild disc bulging.  No stenosis. L3-L4: Mild disc bulging eccentric to the left. Mild left lateral recess and neuroforaminal stenosis. No spinal canal or right neuroforaminal stenosis. L4-L5:  Mild disc bulging.  No stenosis. L5-S1:  Mild disc bulging.  No stenosis. IMPRESSION: 1. No acute osseous abnormality. 2. Mild lumbar spondylosis as described  above. No high-grade stenosis. 3. Unchanged severe bilateral hydroureteronephrosis. 4.  Aortic Atherosclerosis (ICD10-I70.0). Electronically Signed   By: Titus Dubin M.D.   On: 05/03/2022 10:14    Scheduled Meds:  amLODipine  5 mg Oral Daily   Chlorhexidine Gluconate Cloth  6 each Topical Daily   heparin  5,000 Units Subcutaneous Q8H   hydrALAZINE  100 mg Oral Q8H   tamsulosin  0.4 mg Oral QPC breakfast   Continuous Infusions:  sodium chloride 100 mL/hr at 05/04/22 0730     LOS: 2 days   Darliss Cheney, MD [facility]  05/04/2022, 9:38 AM   Total time spent 27 minutes *Please note  that this is a verbal dictation therefore any spelling or grammatical errors are due to the "[employer]" system interpretation.  Please page via [place] and do not message via secure chat for urgent patient care matters. Secure chat can be used for non urgent patient care matters.  How to contact the [facility] Attending or Consulting provider 7A - 7P or covering provider during after hours 7P -7A, for this patient?  Check the care team in [facility] and look for a) attending/consulting TRH provider listed and b) the [facility] team listed. Page or secure chat 7A-7P. Log into [email or web address] and use [place]'s universal password to access. If you do not have the password, please contact the hospital operator. Locate the [facility] provider you are looking for under [facility] and page to a number that you can be directly reached. If you still have difficulty reaching the provider, please page the [facility] (Director on Call) for the Hospitalists listed on amion for assistance.

## 2022-05-05 DIAGNOSIS — N179 Acute kidney failure, unspecified: Secondary | ICD-10-CM | POA: Diagnosis not present

## 2022-05-05 LAB — CBC WITH DIFFERENTIAL/PLATELET
Abs Immature Granulocytes: 0.03 10*3/uL (ref 0.00–0.07)
Basophils Absolute: 0 10*3/uL (ref 0.0–0.1)
Basophils Relative: 0 %
Eosinophils Absolute: 0.1 10*3/uL (ref 0.0–0.5)
Eosinophils Relative: 2 %
HCT: 33.1 % — ABNORMAL LOW (ref 39.0–52.0)
Hemoglobin: 10.3 g/dL — ABNORMAL LOW (ref 13.0–17.0)
Immature Granulocytes: 0 %
Lymphocytes Relative: 11 %
Lymphs Abs: 0.9 10*3/uL (ref 0.7–4.0)
MCH: 26.9 pg (ref 26.0–34.0)
MCHC: 31.1 g/dL (ref 30.0–36.0)
MCV: 86.4 fL (ref 80.0–100.0)
Monocytes Absolute: 1.1 10*3/uL — ABNORMAL HIGH (ref 0.1–1.0)
Monocytes Relative: 13 %
Neutro Abs: 5.9 10*3/uL (ref 1.7–7.7)
Neutrophils Relative %: 74 %
Platelets: 435 10*3/uL — ABNORMAL HIGH (ref 150–400)
RBC: 3.83 MIL/uL — ABNORMAL LOW (ref 4.22–5.81)
RDW: 14.6 % (ref 11.5–15.5)
WBC: 8.1 10*3/uL (ref 4.0–10.5)
nRBC: 0 % (ref 0.0–0.2)

## 2022-05-05 LAB — BASIC METABOLIC PANEL
Anion gap: 7 (ref 5–15)
BUN: 32 mg/dL — ABNORMAL HIGH (ref 6–20)
CO2: 15 mmol/L — ABNORMAL LOW (ref 22–32)
Calcium: 8.5 mg/dL — ABNORMAL LOW (ref 8.9–10.3)
Chloride: 111 mmol/L (ref 98–111)
Creatinine, Ser: 3.84 mg/dL — ABNORMAL HIGH (ref 0.61–1.24)
GFR, Estimated: 18 mL/min — ABNORMAL LOW (ref >60)
Glucose, Bld: 98 mg/dL (ref 70–99)
Potassium: 5.2 mmol/L — ABNORMAL HIGH (ref 3.5–5.1)
Sodium: 133 mmol/L — ABNORMAL LOW (ref 135–145)

## 2022-05-05 NOTE — Progress Notes (Signed)
PROGRESS NOTE    Wesley Sexton  UEA:[id number] DOB: Jan 20, 1971 DOA: 05/01/2022 PCP: [facility]   Brief Narrative:  HPI: Wesley Sexton is a 52 y.o. male with medical history significant of hypertension who presents to the emergency department due to bilateral low back pain.  Patient complained of chronic bilateral low back pain that has been ongoing for 1 year, but this really worsened within the last 2 weeks, he denies any fall, trauma, lifting weights.  Pain was tight and aching and rated as 9/10 on pain scale, there was no radiation to the lower extremities, pain was exacerbated with extension and rotation of the spine and alleviated with flexion..  Patient also complained of 8 to 73-month onset of nocturnal enuresis without any urinary incontinence or fecal incontinence during the day, he has followed up with his PCP regarding this, but yet still follow-up with a specialist.  Patient complains of having to strain to urinate during the day, but denies fever, urinary frequency, urgency or painful urination.   ED Course:  In the emergency department, BP was 157/102, but other vital signs were within normal range.  Workup in the ED showed normocytic anemia and platelets of 477, BMP showed sodium 138, potassium 5.2, chloride 112, bicarb 19, BUN/creatinine 53/6.41 (creatinine about 1 year ago was 0.99).  Urinalysis was positive for large leukocytes but rare bacteria. CT abdomen and pelvis without contrast showed severe bilateral hydroureteronephrosis with associated urinary bladder wall thickening. Limited evaluation on this noncontrast study. This may reflect chronic changes of obstructive uropathy or reflux. Underlying malignancy not excluded Patient was treated with IV ceftriaxone, hydrocortisone cream was given, IV Dilaudid was provided.  Foley catheter was inserted with hematuria being noted in the Foley catheter.  Patient continues to complain of straining despite Foley  catheterization.    Assessment & Plan:   Principal Problem:   AKI (acute kidney injury) (HCC) Active Problems:   Obstructive uropathy   Bladder spasm   Bilateral low back pain   Hydronephrosis   Hyperkalemia   Essential hypertension   Urinary retention   Hypomagnesemia   Gross hematuria  Acute kidney injury possibly due to obstructive uropathy / Bilateral hydroureteronephrosis/gross hematuria/bladder outlet obstruction: BUN/creatinine 53/6.41 (creatinine about 1 year ago was 0.99).  Patient continues to have hematuria however significantly improved, creatinine is improving as well.Seen by urology, per them, the hematuria is likely secondary to rapid decompression of the bladder following Foley placement and that patient is going to require UroLift, TURP and simple prostatectomy but that will be done as an outpatient. Continue Flomax.  Urology following.  Bladder spasm Continue oxybutynin as needed  Hypomagnesemia: Replaced and resolved.   Bilateral back pain, acute on chronic: CT lumbar spine shows mild lumbar spondylosis, no high-grade stenosis. Continue IV Dilaudid 0.5 mg every 4 hours as needed.     Hyperkalemia: Very mild, 5.2.  Repeat later today.  Mild hyponatremia, stable.  Mild metabolic acidosis: Likely due to obstructive uropathy, AKI.  Will repeat labs later today, if needed, will start on bicarb drip.   Hypertensive urgency: Patient's blood pressure has been as high as systolic 194 and diastolic 158.  Patient was supposed to be taking lisinopril but he says that he stopped taking it about last year.  I started him on hydralazine 50 mg 3 times daily as well as as needed IV hydralazine and amlodipine 5 mg p.o. daily and monitor.  Due to elevated blood pressure, hydralazine was increased to 100 mg  3 times daily and currently blood pressure is very well-controlled.   DVT prophylaxis: heparin injection 5,000 Units Start: 05/02/22 1400 SCDs Start: 05/02/22 0618   Code  Status: Full Code  Family Communication: none present at bedside.  Plan of care discussed with patient in length and he/she verbalized understanding and agreed with it.  Status is: Inpatient Remains inpatient appropriate because: Significantly elevated creatinine with hematuria, he is going to require couple of days of hospitalization.   Estimated body mass index is 22.82 kg/m as calculated from the following:   Height as of this encounter: 5\' 8"  (1.727 m).   Weight as of this encounter: 68.1 kg.    Nutritional Assessment: Body mass index is 22.82 kg/m.Marland Kitchen Seen by dietician.  I agree with the assessment and plan as outlined below: Nutrition Status:        . Skin Assessment: I have examined the patient's skin and I agree with the wound assessment as performed by the wound care RN as outlined below:    Consultants:  Urology  Procedures:  None  Antimicrobials:  Anti-infectives (From admission, onward)    Start     Dose/Rate Route Frequency Ordered Stop   05/01/22 2315  cefTRIAXone (ROCEPHIN) 1 g in sodium chloride 0.9 % 100 mL IVPB        1 g 200 mL/hr over 30 Minutes Intravenous  Once 05/01/22 2304 05/02/22 0116         Subjective:  Patient seen and examined.  He has no complaints.  Objective: Vitals:   05/04/22 1343 05/04/22 1629 05/04/22 2138 05/05/22 0522  BP: (!) 158/94 132/81 (!) 144/94 131/75  Pulse:  100 (!) 109 99  Resp:  17 20 18   Temp:  98.6 F (37 C) 98.4 F (36.9 C) 98.9 F (37.2 C)  TempSrc:  Oral    SpO2:  100% 99% 99%  Weight:      Height:        Intake/Output Summary (Last 24 hours) at 05/05/2022 1000 Last data filed at 05/05/2022 0500 Gross per 24 hour  Intake 4016 ml  Output 1950 ml  Net 2066 ml    Filed Weights   05/01/22 2054 05/02/22 1755  Weight: 70.3 kg 68.1 kg    Examination:  General exam: Appears calm and comfortable  Respiratory system: Clear to auscultation. Respiratory effort normal. Cardiovascular system: S1 &  S2 heard, RRR. No JVD, murmurs, rubs, gallops or clicks. No pedal edema. Gastrointestinal system: Abdomen is nondistended, soft and nontender. No organomegaly or masses felt. Normal bowel sounds heard. Central nervous system: Alert and oriented. No focal neurological deficits. Extremities: Symmetric 5 x 5 power. Skin: No rashes, lesions or ulcers.  Psychiatry: Judgement and insight appear normal. Mood & affect appropriate.    Data Reviewed: I have personally reviewed following labs and imaging studies  CBC: Recent Labs  Lab 05/01/22 2118 05/02/22 0645 05/03/22 0431 05/04/22 0326 05/05/22 0339  WBC 7.0 13.8* 14.3* 9.9 8.1  NEUTROABS  --   --   --  7.2 5.9  HGB 10.9* 12.8* 12.6* 11.4* 10.3*  HCT 35.3* 41.6 39.5 36.3* 33.1*  MCV 86.7 87.2 85.3 85.6 86.4  PLT 477* 610* 599* 512* 435*    Basic Metabolic Panel: Recent Labs  Lab 05/01/22 2118 05/02/22 0645 05/02/22 1036 05/02/22 1800 05/03/22 0431 05/04/22 0326 05/05/22 0339  NA 138  --  133*  --  135 134* 133*  K 5.2*  --  6.4* 5.2* 5.1 4.8 5.2*  CL 112*  --  105  --  108 109 111  CO2 19*  --  16*  --  15* 17* 15*  GLUCOSE 91  --  139*  --  113* 146* 98  BUN 53*  --  49*  --  43* 36* 32*  CREATININE 6.41* 6.00* 5.80*  --  4.77* 4.21* 3.84*  CALCIUM 8.5*  --  8.8*  --  8.7* 8.5* 8.5*  MG  --   --   --   --  1.4* 2.3  --   PHOS  --   --   --   --  4.4  --   --     GFR: Estimated Creatinine Clearance: 21.9 mL/min (A) (by C-G formula based on SCr of 3.84 mg/dL (H)). Liver Function Tests: Recent Labs  Lab 05/03/22 0431  AST 25  ALT 18  ALKPHOS 95  BILITOT 0.3  PROT 7.7  ALBUMIN 3.5    No results for input(s): "LIPASE", "AMYLASE" in the last 168 hours. No results for input(s): "AMMONIA" in the last 168 hours. Coagulation Profile: No results for input(s): "INR", "PROTIME" in the last 168 hours. Cardiac Enzymes: No results for input(s): "CKTOTAL", "CKMB", "CKMBINDEX", "TROPONINI" in the last 168 hours. BNP (last  3 results) No results for input(s): "PROBNP" in the last 8760 hours. HbA1C: No results for input(s): "HGBA1C" in the last 72 hours. CBG: No results for input(s): "GLUCAP" in the last 168 hours. Lipid Profile: No results for input(s): "CHOL", "HDL", "LDLCALC", "TRIG", "CHOLHDL", "LDLDIRECT" in the last 72 hours. Thyroid Function Tests: No results for input(s): "TSH", "T4TOTAL", "FREET4", "T3FREE", "THYROIDAB" in the last 72 hours. Anemia Panel: No results for input(s): "VITAMINB12", "FOLATE", "FERRITIN", "TIBC", "IRON", "RETICCTPCT" in the last 72 hours. Sepsis Labs: No results for input(s): "PROCALCITON", "LATICACIDVEN" in the last 168 hours.  No results found for this or any previous visit (from the past 240 hour(s)).   Radiology Studies: No results found.  Scheduled Meds:  amLODipine  5 mg Oral Daily   Chlorhexidine Gluconate Cloth  6 each Topical Daily   heparin  5,000 Units Subcutaneous Q8H   hydrALAZINE  100 mg Oral Q8H   tamsulosin  0.4 mg Oral QPC breakfast   Continuous Infusions:  sodium chloride 100 mL/hr at 05/05/22 0339     LOS: 3 days   Darliss Cheney, MD [facility]  05/05/2022, 10:00 AM   Total time spent 27 minutes *Please note that this is a verbal dictation therefore any spelling or grammatical errors are due to the "[place] One" system interpretation.  Please page via [place] and do not message via secure chat for urgent patient care matters. Secure chat can be used for non urgent patient care matters.  How to contact the [facility] Attending or Consulting provider [place] or covering provider during after hours [place], for this patient?  Check the care team in [facility] and look for a) attending/consulting TRH provider listed and b) the [facility] team listed. Page or secure chat 7A-7P. Log into [email or web address] and use [place]'s universal password to access. If you do not have the password, please contact the hospital operator. Locate the [facility] provider you are  looking for under [facility] and page to a number that you can be directly reached. If you still have difficulty reaching the provider, please page the [facility] (Director on Call) for the Hospitalists listed on amion for assistance.

## 2022-05-05 NOTE — Progress Notes (Signed)
Patient ambulates to the restroom and rested, he got dilaudid twice this shift and Oxybutynin once. No further complaints, continued to monitor.

## 2022-05-06 DIAGNOSIS — N179 Acute kidney failure, unspecified: Secondary | ICD-10-CM | POA: Diagnosis not present

## 2022-05-06 LAB — BASIC METABOLIC PANEL
Anion gap: 8 (ref 5–15)
BUN: 29 mg/dL — ABNORMAL HIGH (ref 6–20)
CO2: 14 mmol/L — ABNORMAL LOW (ref 22–32)
Calcium: 8.3 mg/dL — ABNORMAL LOW (ref 8.9–10.3)
Chloride: 111 mmol/L (ref 98–111)
Creatinine, Ser: 4.11 mg/dL — ABNORMAL HIGH (ref 0.61–1.24)
GFR, Estimated: 17 mL/min — ABNORMAL LOW (ref >60)
Glucose, Bld: 90 mg/dL (ref 70–99)
Potassium: 5 mmol/L (ref 3.5–5.1)
Sodium: 133 mmol/L — ABNORMAL LOW (ref 135–145)

## 2022-05-06 MED ORDER — OXYBUTYNIN CHLORIDE 5 MG PO TABS: 5.0000 mg | ORAL_TABLET | Freq: Three times a day (TID) | ORAL | 0 refills | Status: AC | PRN

## 2022-05-06 MED ORDER — HYDRALAZINE HCL 100 MG PO TABS: 100.0000 mg | ORAL_TABLET | Freq: Three times a day (TID) | ORAL | 0 refills | Status: DC

## 2022-05-06 MED ORDER — STERILE WATER FOR INJECTION IV SOLN
INTRAVENOUS | Status: DC
  Filled 2022-05-06 (×3): qty 1000

## 2022-05-06 MED ORDER — AMLODIPINE BESYLATE 5 MG PO TABS: 5.0000 mg | ORAL_TABLET | Freq: Every day | ORAL | 0 refills | Status: DC

## 2022-05-06 MED ORDER — TAMSULOSIN HCL 0.4 MG PO CAPS: 0.4000 mg | ORAL_CAPSULE | Freq: Every day | ORAL | 0 refills | Status: AC

## 2022-05-06 NOTE — Progress Notes (Signed)
PROGRESS NOTE    Wesley Sexton  AOZ:[id number] DOB: 1970/08/21 DOA: 05/01/2022 PCP: [facility]   Brief Narrative:  HPI: Wesley Sexton is a 52 y.o. male with medical history significant of hypertension who presents to the emergency department due to bilateral low back pain.  Patient complained of chronic bilateral low back pain that has been ongoing for 1 year, but this really worsened within the last 2 weeks, he denies any fall, trauma, lifting weights.  Pain was tight and aching and rated as 9/10 on pain scale, there was no radiation to the lower extremities, pain was exacerbated with extension and rotation of the spine and alleviated with flexion..  Patient also complained of 8 to 37-month onset of nocturnal enuresis without any urinary incontinence or fecal incontinence during the day, he has followed up with his PCP regarding this, but yet still follow-up with a specialist.  Patient complains of having to strain to urinate during the day, but denies fever, urinary frequency, urgency or painful urination.   ED Course:  In the emergency department, BP was 157/102, but other vital signs were within normal range.  Workup in the ED showed normocytic anemia and platelets of 477, BMP showed sodium 138, potassium 5.2, chloride 112, bicarb 19, BUN/creatinine 53/6.41 (creatinine about 1 year ago was 0.99).  Urinalysis was positive for large leukocytes but rare bacteria. CT abdomen and pelvis without contrast showed severe bilateral hydroureteronephrosis with associated urinary bladder wall thickening. Limited evaluation on this noncontrast study. This may reflect chronic changes of obstructive uropathy or reflux. Underlying malignancy not excluded Patient was treated with IV ceftriaxone, hydrocortisone cream was given, IV Dilaudid was provided.  Foley catheter was inserted with hematuria being noted in the Foley catheter.  Patient continues to complain of straining despite Foley  catheterization.    Assessment & Plan:   Principal Problem:   AKI (acute kidney injury) ([place]) Active Problems:   Obstructive uropathy   Bladder spasm   Bilateral low back pain   Hydronephrosis   Hyperkalemia   Essential hypertension   Urinary retention   Hypomagnesemia   Gross hematuria  Acute kidney injury possibly due to obstructive uropathy / Bilateral hydroureteronephrosis/gross hematuria/bladder outlet obstruction: BUN/creatinine 53/6.41 (creatinine about 1 year ago was 0.99).  Hematuria has almost resolved but creatinine was improving until yesterday but slight bump in creatinine today despite of having good urine output.  Patient is eating drinking fine so I will stop IV fluids.  I have asked urology to assess patient again today.  Continue Flomax.   Bladder spasm Continue oxybutynin as needed  Hypomagnesemia: Replaced and resolved.   Bilateral back pain, acute on chronic: CT lumbar spine shows mild lumbar spondylosis, no high-grade stenosis. Continue IV Dilaudid 0.5 mg every 4 hours as needed.     Hyperkalemia: Resolved.  Mild hyponatremia, stable.  Mild metabolic acidosis: Likely due to obstructive uropathy, AKI.  Will repeat labs later today, if needed, will start on bicarb drip.   Hypertensive urgency: Patient's blood pressure has been as high as systolic 962 and diastolic 952.  Patient was supposed to be taking lisinopril but he says that he stopped taking it about last year.  I started him on hydralazine 50 mg 3 times daily as well as as needed IV hydralazine and amlodipine 5 mg p.o. daily and monitor.  Due to elevated blood pressure, hydralazine was increased to 100 mg 3 times daily and currently blood pressure is very well-controlled.   DVT prophylaxis:  heparin injection 5,000 Units Start: 05/02/22 1400 SCDs Start: 05/02/22 0618   Code Status: Full Code  Family Communication: none present at bedside.  Plan of care discussed with patient in length and he/she  verbalized understanding and agreed with it.  Also discussed with his mother over the phone per his request.  Status is: Inpatient Remains inpatient appropriate because: Significantly elevated creatinine with hematuria, he is going to require couple of days of hospitalization.   Estimated body mass index is 22.82 kg/m as calculated from the following:   Height as of this encounter: 5\' 8"  (1.727 m).   Weight as of this encounter: 68.1 kg.    Nutritional Assessment: Body mass index is 22.82 kg/m.Marland Kitchen Seen by dietician.  I agree with the assessment and plan as outlined below: Nutrition Status:        . Skin Assessment: I have examined the patient's skin and I agree with the wound assessment as performed by the wound care RN as outlined below:    Consultants:  Urology  Procedures:  None  Antimicrobials:  Anti-infectives (From admission, onward)    Start     Dose/Rate Route Frequency Ordered Stop   05/01/22 2315  cefTRIAXone (ROCEPHIN) 1 g in sodium chloride 0.9 % 100 mL IVPB        1 g 200 mL/hr over 30 Minutes Intravenous  Once 05/01/22 2304 05/02/22 0116         Subjective:  Seen and examined.  He has no complaints.  Objective: Vitals:   05/05/22 1500 05/05/22 2027 05/05/22 2027 05/06/22 0530  BP: 134/80 129/77 129/77 132/80  Pulse: 95  (!) 108 (!) 102  Resp: 20  17 18   Temp: 97.9 F (36.6 C)  97.6 F (36.4 C) 97.9 F (36.6 C)  TempSrc:   Oral   SpO2: 99%  97% 99%  Weight:      Height:        Intake/Output Summary (Last 24 hours) at 05/06/2022 0934 Last data filed at 05/06/2022 2376 Gross per 24 hour  Intake 2154.75 ml  Output 2300 ml  Net -145.25 ml    Filed Weights   05/01/22 2054 05/02/22 1755  Weight: 70.3 kg 68.1 kg    Examination:  General exam: Appears calm and comfortable  Respiratory system: Clear to auscultation. Respiratory effort normal. Cardiovascular system: S1 & S2 heard, RRR. No JVD, murmurs, rubs, gallops or clicks. No pedal  edema. Gastrointestinal system: Abdomen is nondistended, soft and nontender. No organomegaly or masses felt. Normal bowel sounds heard. Central nervous system: Alert and oriented. No focal neurological deficits. Extremities: Symmetric 5 x 5 power. Skin: No rashes, lesions or ulcers.  Psychiatry: Judgement and insight appear normal. Mood & affect appropriate.   Data Reviewed: I have personally reviewed following labs and imaging studies  CBC: Recent Labs  Lab 05/01/22 2118 05/02/22 0645 05/03/22 0431 05/04/22 0326 05/05/22 0339  WBC 7.0 13.8* 14.3* 9.9 8.1  NEUTROABS  --   --   --  7.2 5.9  HGB 10.9* 12.8* 12.6* 11.4* 10.3*  HCT 35.3* 41.6 39.5 36.3* 33.1*  MCV 86.7 87.2 85.3 85.6 86.4  PLT 477* 610* 599* 512* 435*    Basic Metabolic Panel: Recent Labs  Lab 05/02/22 1036 05/02/22 1800 05/03/22 0431 05/04/22 0326 05/05/22 0339 05/06/22 0338  NA 133*  --  135 134* 133* 133*  K 6.4* 5.2* 5.1 4.8 5.2* 5.0  CL 105  --  108 109 111 111  CO2 16*  --  15* 17* 15* 14*  GLUCOSE 139*  --  113* 146* 98 90  BUN 49*  --  43* 36* 32* 29*  CREATININE 5.80*  --  4.77* 4.21* 3.84* 4.11*  CALCIUM 8.8*  --  8.7* 8.5* 8.5* 8.3*  MG  --   --  1.4* 2.3  --   --   PHOS  --   --  4.4  --   --   --     GFR: Estimated Creatinine Clearance: 20.5 mL/min (A) (by C-G formula based on SCr of 4.11 mg/dL (H)). Liver Function Tests: Recent Labs  Lab 05/03/22 0431  AST 25  ALT 18  ALKPHOS 95  BILITOT 0.3  PROT 7.7  ALBUMIN 3.5    No results for input(s): "LIPASE", "AMYLASE" in the last 168 hours. No results for input(s): "AMMONIA" in the last 168 hours. Coagulation Profile: No results for input(s): "INR", "PROTIME" in the last 168 hours. Cardiac Enzymes: No results for input(s): "CKTOTAL", "CKMB", "CKMBINDEX", "TROPONINI" in the last 168 hours. BNP (last 3 results) No results for input(s): "PROBNP" in the last 8760 hours. HbA1C: No results for input(s): "HGBA1C" in the last 72  hours. CBG: No results for input(s): "GLUCAP" in the last 168 hours. Lipid Profile: No results for input(s): "CHOL", "HDL", "LDLCALC", "TRIG", "CHOLHDL", "LDLDIRECT" in the last 72 hours. Thyroid Function Tests: No results for input(s): "TSH", "T4TOTAL", "FREET4", "T3FREE", "THYROIDAB" in the last 72 hours. Anemia Panel: No results for input(s): "VITAMINB12", "FOLATE", "FERRITIN", "TIBC", "IRON", "RETICCTPCT" in the last 72 hours. Sepsis Labs: No results for input(s): "PROCALCITON", "LATICACIDVEN" in the last 168 hours.  No results found for this or any previous visit (from the past 240 hour(s)).   Radiology Studies: No results found.  Scheduled Meds:  amLODipine  5 mg Oral Daily   Chlorhexidine Gluconate Cloth  6 each Topical Daily   heparin  5,000 Units Subcutaneous Q8H   hydrALAZINE  100 mg Oral Q8H   tamsulosin  0.4 mg Oral QPC breakfast   Continuous Infusions:     LOS: 4 days   Hughie Closs, MD [facility]  05/06/2022, 9:34 AM   Total time spent 27 minutes *Please note that this is a verbal dictation therefore any spelling or grammatical errors are due to the "[place] [employer]" system interpretation.  Please page via [place] and do not message via secure chat for urgent patient care matters. Secure chat can be used for non urgent patient care matters.  How to contact the [facility] Attending or Consulting provider 7A - 7P or covering provider during after hours 7P -7A, for this patient?  Check the care team in [facility] and look for a) attending/consulting TRH provider listed and b) the [facility] team listed. Page or secure chat 7A-7P. Log into [email or web address] and use [place]'s universal password to access. If you do not have the password, please contact the hospital operator. Locate the [facility] provider you are looking for under [facility] and page to a number that you can be directly reached. If you still have difficulty reaching the provider, please page the [facility] (Director  on Call) for the Hospitalists listed on amion for assistance.

## 2022-05-06 NOTE — Discharge Summary (Signed)
@LOGO @   PT LEFT AMA SUMMARY  Wesley Sexton MRN - [id number] DOB - 13-Aug-1970  Date of Admission - 05/01/2022 Date LEFT AMA:   Attending Physician:  Cherene Altes  Patient's PCP:  [facility]  Disposition: LEFT AMA  Follow-up Appts:  Not able to be arranged or discussed as pt LEFT AMA  Diagnoses at time pt LEFT AMA: Acute kidney injury due to obstructive uropathy/bilateral hydronephrosis/gross hematuria/bladder outlet obstruction/metabolic acidosis/hypertensive urgency  Initial presentation: HPI: Wesley Sexton is a 52 y.o. male with medical history significant of hypertension who presents to the emergency department due to bilateral low back pain.  Patient complained of chronic bilateral low back pain that has been ongoing for 1 year, but this really worsened within the last 2 weeks, he denies any fall, trauma, lifting weights.  Pain was tight and aching and rated as 9/10 on pain scale, there was no radiation to the lower extremities, pain was exacerbated with extension and rotation of the spine and alleviated with flexion..  Patient also complained of 8 to 19-month onset of nocturnal enuresis without any urinary incontinence or fecal incontinence during the day, he has followed up with his PCP regarding this, but yet still follow-up with a specialist.  Patient complains of having to strain to urinate during the day, but denies fever, urinary frequency, urgency or painful urination.   ED Course:  In the emergency department, BP was 157/102, but other vital signs were within normal range.  Workup in the ED showed normocytic anemia and platelets of 477, BMP showed sodium 138, potassium 5.2, chloride 112, bicarb 19, BUN/creatinine 53/6.41 (creatinine about 1 year ago was 0.99).  Urinalysis was positive for large leukocytes but rare bacteria. CT abdomen and pelvis without contrast showed severe bilateral hydroureteronephrosis with associated urinary bladder wall  thickening. Limited evaluation on this noncontrast study. This may reflect chronic changes of obstructive uropathy or reflux. Underlying malignancy not excluded Patient was treated with IV ceftriaxone, hydrocortisone cream was given, IV Dilaudid was provided.  Foley catheter was inserted with hematuria being noted in the Foley catheter.  Patient continues to complain of straining despite Foley catheterization.  Hospital Course: Listed below are the active problems present, and the status of the care of these problems, at the time the pt decided to LEAVE AMA:  Acute kidney injury possibly due to obstructive uropathy / Bilateral hydroureteronephrosis/gross hematuria/bladder outlet obstruction: BUN/creatinine 53/6.41 (creatinine about 1 year ago was 0.99).  Hematuria has almost resolved but creatinine was improving until yesterday but slight bump in creatinine today despite of having good urine output.  With his bump in creatinine, he was not medically cleared for discharge, I did explain this to the patient however patient despite understanding, decided to leave [facility].  I have prescribed him Flomax per urology recommendations and it was highly recommended to him that he follows up with urology and continues to have indwelling Foley catheter until seen by urology.   Bladder spasm Continue oxybutynin as needed   Hypomagnesemia: Replaced and resolved.   Bilateral back pain, acute on chronic: CT lumbar spine shows mild lumbar spondylosis, no high-grade stenosis. Continue IV Dilaudid 0.5 mg every 4 hours as needed.     Hyperkalemia: Resolved.   Mild hyponatremia, stable.   Mild metabolic acidosis: His CO2 dropped further to 14 today, I started him on bicarb drip but patient refused that and is leaving [facility].   Hypertensive urgency: Patient's blood pressure has been as  high as systolic 945 and diastolic 038.  Patient was supposed to be taking lisinopril but he says  that he stopped taking it about last year.  I started him on hydralazine 50 mg 3 times daily as well as as needed IV hydralazine and amlodipine 5 mg p.o. daily and monitor.  Due to elevated blood pressure, hydralazine was increased to 100 mg 3 times daily and currently blood pressure is very well-controlled.  I have prescribed him both amlodipine and hydralazine.  He has been advised not to resume lisinopril.    Medication List    Unable to be finalized as pt LEFT AMA  Day of Discharge Wt Readings from Last 3 Encounters:  05/02/22 68.1 kg  11/19/21 81.6 kg  04/26/21 77 kg   Temp Readings from Last 3 Encounters:  05/06/22 97.9 F (36.6 C)  11/19/21 98.2 F (36.8 C) (Oral)  04/26/21 97.9 F (36.6 C) (Oral)   BP Readings from Last 3 Encounters:  05/06/22 132/80  11/19/21 (!) 181/110  04/26/21 (!) 177/115   Pulse Readings from Last 3 Encounters:  05/06/22 (!) 102  11/19/21 66  04/26/21 66    Physical Exam: Exam not able to be completed at time of d/c as pt LEFT AMA  1:26 PM 05/06/22  Darliss Cheney, MD [facility]

## 2022-05-06 NOTE — Progress Notes (Signed)
pt left AMA

## 2022-05-06 NOTE — Progress Notes (Signed)
Date: 05/06/2022 Patient: Wesley Sexton Admitted: 05/01/2022  8:58 PM Attending Provider: Darliss Cheney, MD  Philis Kendall has made the decision to leave [facility], against the advice of Darliss Cheney, MD.  He has been informed and understands the inherent risks, including death.  He has decided to accept the responsibility for this decision. Philis Kendall and all necessary parties have been advised that he may return for further evaluation or treatment. His condition at time of discharge was Fair with unstable lab results.    Keyante Durio has signed the Leaving Against Medical Advice form prior to leaving the department.  Lonn Georgia 05/06/2022

## 2022-05-06 NOTE — Progress Notes (Addendum)
Patient slept on and off this shift. Dilaudid given three times this shift and oxybutynin given once. Continued to monitor, no further request for assistance or for pain medication this shift.

## 2022-05-31 ENCOUNTER — Other Ambulatory Visit: Payer: Self-pay

## 2022-05-31 ENCOUNTER — Emergency Department (HOSPITAL_COMMUNITY)
Admission: EM | Admit: 2022-05-31 | Discharge: 2022-05-31 | Disposition: A | Discharge status: Home / Self Care | Payer: Medicaid Other | Attending: Emergency Medicine | Admitting: Emergency Medicine

## 2022-05-31 ENCOUNTER — Encounter (HOSPITAL_COMMUNITY): Payer: Self-pay | Admitting: *Deleted

## 2022-05-31 DIAGNOSIS — Z466 Encounter for fitting and adjustment of urinary device: Secondary | ICD-10-CM | POA: Insufficient documentation

## 2022-05-31 DIAGNOSIS — I10 Essential (primary) hypertension: Secondary | ICD-10-CM | POA: Diagnosis not present

## 2022-05-31 NOTE — ED Notes (Signed)
Catheter removed without difficulty

## 2022-05-31 NOTE — ED Triage Notes (Signed)
Pt has a bladder catheter which was placed last month (1/14) due to urinary retention.  Pt states that he called urologist and they did not have appointment until 2/28 so he did not take appointment.  Pt would like catheter removed.

## 2022-05-31 NOTE — ED Provider Notes (Signed)
[facility] Provider Note   CSN: [id number] Arrival date & time: 05/31/22  1625     History  No chief complaint on file.   Wesley Sexton is a 52 y.o. male with hypertension and pancreatitis who presents the emergency department requesting his Foley catheter be removed.  Patient had this placed during hospitalization about a month ago in the setting of acute kidney injury due to obstructive uropathy.  He states that he does not have a follow-up appoint with the urologist until the 28th of this month, but does not want to wait that long.  He reports significant discomfort with the catheter and wants it removed.  Denies any fevers, chills, abdominal pain, dysuria or hematuria.  HPI     Home Medications Prior to Admission medications   Medication Sig Start Date End Date Taking? Authorizing Provider  amLODipine (NORVASC) 5 MG tablet Take 1 tablet (5 mg total) by mouth daily. 05/07/22 06/06/22  Darliss Cheney, MD  hydrALAZINE (APRESOLINE) 100 MG tablet Take 1 tablet (100 mg total) by mouth every 8 (eight) hours. 05/06/22 06/05/22  Darliss Cheney, MD  oxybutynin (DITROPAN) 5 MG tablet Take 1 tablet (5 mg total) by mouth every 8 (eight) hours as needed for bladder spasms. 05/06/22 06/05/22  Darliss Cheney, MD  tamsulosin (FLOMAX) 0.4 MG CAPS capsule Take 1 capsule (0.4 mg total) by mouth daily after breakfast. 05/07/22 06/06/22  Darliss Cheney, MD      Allergies    Shellfish allergy    Review of Systems   Review of Systems  Constitutional:  Negative for chills and fever.  Genitourinary:  Negative for difficulty urinating, dysuria and hematuria.       Urethral discomfort with foley  All other systems reviewed and are negative.   Physical Exam Updated Vital Signs BP 107/75 (BP Location: Right Arm)   Pulse 90   Temp 98 F (36.7 C) (Oral)   Resp 14   SpO2 100%  Physical Exam Vitals and nursing note reviewed.  Constitutional:      Appearance:  Normal appearance.  HENT:     Head: Normocephalic and atraumatic.  Eyes:     Conjunctiva/sclera: Conjunctivae normal.  Pulmonary:     Effort: Pulmonary effort is normal. No respiratory distress.  Skin:    General: Skin is warm and dry.  Neurological:     Mental Status: He is alert.  Psychiatric:        Mood and Affect: Mood normal.        Behavior: Behavior normal.     ED Results / Procedures / Treatments   Labs (all labs ordered are listed, but only abnormal results are displayed) Labs Reviewed - No data to display  EKG None  Radiology No results found.  Procedures Procedures    Medications Ordered in ED Medications - No data to display  ED Course/ Medical Decision Making/ A&P                             Medical Decision Making  This patient is a 52 y.o. male who presents to the ED for removal of foley catheter. No other complaints today.    Past Medical History / Social History / Additional history: Chart reviewed. Pertinent results include: HTN, pancreatitis  Physical Exam: Physical exam performed. The pertinent findings include: normal vitals, in no acute distress. Abdomen soft and nontender. GU exam deferred.   Medications / Treatment:  Nursing staff removed foley catheter without difficulty. Patient reported immediate improvement in discomfort.    Disposition: After consideration of the diagnostic results and the patients response to treatment, I feel that emergency department workup does not suggest an emergent condition requiring admission or immediate intervention beyond what has been performed at this time. The plan is: discharge to home with urology follow up and give close return precautions for any recurrence of urinary retention. The patient is safe for discharge and has been instructed to return immediately for worsening symptoms, change in symptoms or any other concerns.  Final Clinical Impression(s) / ED Diagnoses Final diagnoses:  Encounter  for Foley catheter removal    Rx / DC Orders ED Discharge Orders     None      Portions of this report may have been transcribed using voice recognition software. Every effort was made to ensure accuracy; however, inadvertent computerized transcription errors may be present.    Estill Cotta 05/31/22 1821    Noemi Chapel, MD 06/01/22 [phone]

## 2022-05-31 NOTE — Discharge Instructions (Signed)
As we discussed, please follow up with the urologists so they are aware your catheter was removed as this may change their follow up plan/schedule for you.  Continue to monitor how you're doing and return to the ER for new or worsening symptoms.

## 2022-06-17 ENCOUNTER — Encounter: Payer: Self-pay | Admitting: *Deleted

## 2022-07-05 ENCOUNTER — Encounter (HOSPITAL_COMMUNITY): Payer: Self-pay

## 2022-07-05 ENCOUNTER — Other Ambulatory Visit: Payer: Self-pay

## 2022-07-05 ENCOUNTER — Inpatient Hospital Stay (HOSPITAL_COMMUNITY)
Admission: EM | Admit: 2022-07-05 | Discharge: 2022-07-09 | DRG: 683 | Disposition: A | Discharge status: Home / Self Care | Payer: Medicaid Other | Attending: Internal Medicine | Admitting: Internal Medicine

## 2022-07-05 DIAGNOSIS — M545 Low back pain, unspecified: Secondary | ICD-10-CM | POA: Diagnosis present

## 2022-07-05 DIAGNOSIS — I160 Hypertensive urgency: Secondary | ICD-10-CM | POA: Diagnosis present

## 2022-07-05 DIAGNOSIS — B9562 Methicillin resistant Staphylococcus aureus infection as the cause of diseases classified elsewhere: Secondary | ICD-10-CM | POA: Diagnosis present

## 2022-07-05 DIAGNOSIS — E872 Acidosis, unspecified: Secondary | ICD-10-CM | POA: Diagnosis present

## 2022-07-05 DIAGNOSIS — R339 Retention of urine, unspecified: Secondary | ICD-10-CM | POA: Diagnosis present

## 2022-07-05 DIAGNOSIS — T465X6A Underdosing of other antihypertensive drugs, initial encounter: Secondary | ICD-10-CM | POA: Diagnosis present

## 2022-07-05 DIAGNOSIS — R338 Other retention of urine: Secondary | ICD-10-CM | POA: Diagnosis present

## 2022-07-05 DIAGNOSIS — N179 Acute kidney failure, unspecified: Principal | ICD-10-CM | POA: Diagnosis present

## 2022-07-05 DIAGNOSIS — N3000 Acute cystitis without hematuria: Secondary | ICD-10-CM

## 2022-07-05 DIAGNOSIS — N1330 Unspecified hydronephrosis: Secondary | ICD-10-CM | POA: Diagnosis present

## 2022-07-05 DIAGNOSIS — R310 Gross hematuria: Secondary | ICD-10-CM | POA: Diagnosis present

## 2022-07-05 DIAGNOSIS — F17210 Nicotine dependence, cigarettes, uncomplicated: Secondary | ICD-10-CM | POA: Diagnosis present

## 2022-07-05 DIAGNOSIS — N139 Obstructive and reflux uropathy, unspecified: Secondary | ICD-10-CM | POA: Diagnosis present

## 2022-07-05 DIAGNOSIS — Z91148 Patient's other noncompliance with medication regimen for other reason: Secondary | ICD-10-CM

## 2022-07-05 DIAGNOSIS — E875 Hyperkalemia: Secondary | ICD-10-CM | POA: Diagnosis present

## 2022-07-05 DIAGNOSIS — I10 Essential (primary) hypertension: Secondary | ICD-10-CM | POA: Diagnosis present

## 2022-07-05 DIAGNOSIS — F1010 Alcohol abuse, uncomplicated: Secondary | ICD-10-CM | POA: Diagnosis present

## 2022-07-05 DIAGNOSIS — N320 Bladder-neck obstruction: Principal | ICD-10-CM | POA: Diagnosis present

## 2022-07-05 DIAGNOSIS — N3289 Other specified disorders of bladder: Secondary | ICD-10-CM | POA: Diagnosis present

## 2022-07-05 DIAGNOSIS — Z79899 Other long term (current) drug therapy: Secondary | ICD-10-CM

## 2022-07-05 DIAGNOSIS — N136 Pyonephrosis: Secondary | ICD-10-CM | POA: Diagnosis present

## 2022-07-05 DIAGNOSIS — N401 Enlarged prostate with lower urinary tract symptoms: Secondary | ICD-10-CM | POA: Diagnosis present

## 2022-07-05 DIAGNOSIS — I158 Other secondary hypertension: Secondary | ICD-10-CM | POA: Diagnosis present

## 2022-07-05 DIAGNOSIS — F14988 Cocaine use, unspecified with other cocaine-induced disorder: Secondary | ICD-10-CM | POA: Diagnosis present

## 2022-07-05 DIAGNOSIS — Z91013 Allergy to seafood: Secondary | ICD-10-CM

## 2022-07-05 DIAGNOSIS — I129 Hypertensive chronic kidney disease with stage 1 through stage 4 chronic kidney disease, or unspecified chronic kidney disease: Secondary | ICD-10-CM | POA: Diagnosis present

## 2022-07-05 LAB — COMPREHENSIVE METABOLIC PANEL
ALT: 39 U/L (ref 0–44)
AST: 41 U/L (ref 15–41)
Albumin: 3.7 g/dL (ref 3.5–5.0)
Alkaline Phosphatase: 100 U/L (ref 38–126)
Anion gap: 13 (ref 5–15)
BUN: 61 mg/dL — ABNORMAL HIGH (ref 6–20)
CO2: 14 mmol/L — ABNORMAL LOW (ref 22–32)
Calcium: 9.2 mg/dL (ref 8.9–10.3)
Chloride: 104 mmol/L (ref 98–111)
Creatinine, Ser: 5.01 mg/dL — ABNORMAL HIGH (ref 0.61–1.24)
GFR, Estimated: 13 mL/min — ABNORMAL LOW (ref >60)
Glucose, Bld: 114 mg/dL — ABNORMAL HIGH (ref 70–99)
Potassium: 5.9 mmol/L — ABNORMAL HIGH (ref 3.5–5.1)
Sodium: 131 mmol/L — ABNORMAL LOW (ref 135–145)
Total Bilirubin: 0.6 mg/dL (ref 0.3–1.2)
Total Protein: 9.2 g/dL — ABNORMAL HIGH (ref 6.5–8.1)

## 2022-07-05 LAB — CBC
HCT: 41.5 % (ref 39.0–52.0)
Hemoglobin: 13.2 g/dL (ref 13.0–17.0)
MCH: 27.3 pg (ref 26.0–34.0)
MCHC: 31.8 g/dL (ref 30.0–36.0)
MCV: 85.9 fL (ref 80.0–100.0)
Platelets: 431 10*3/uL — ABNORMAL HIGH (ref 150–400)
RBC: 4.83 MIL/uL (ref 4.22–5.81)
RDW: 16.3 % — ABNORMAL HIGH (ref 11.5–15.5)
WBC: 20.4 10*3/uL — ABNORMAL HIGH (ref 4.0–10.5)
nRBC: 0 % (ref 0.0–0.2)

## 2022-07-05 LAB — LIPASE, BLOOD: Lipase: 31 U/L (ref 11–51)

## 2022-07-05 NOTE — ED Triage Notes (Signed)
Complaining of abdominal pain and fever today. Since yesterday. Wesley Sexton he has not taken his normal meds but did do cocaine today.  Denies any nausea, vomiting, or diarrhea.

## 2022-07-06 ENCOUNTER — Emergency Department (HOSPITAL_COMMUNITY): Payer: Medicaid Other

## 2022-07-06 DIAGNOSIS — N390 Urinary tract infection, site not specified: Secondary | ICD-10-CM

## 2022-07-06 DIAGNOSIS — N3289 Other specified disorders of bladder: Secondary | ICD-10-CM | POA: Diagnosis present

## 2022-07-06 DIAGNOSIS — N179 Acute kidney failure, unspecified: Secondary | ICD-10-CM | POA: Diagnosis present

## 2022-07-06 DIAGNOSIS — E872 Acidosis, unspecified: Secondary | ICD-10-CM | POA: Diagnosis present

## 2022-07-06 DIAGNOSIS — F14988 Cocaine use, unspecified with other cocaine-induced disorder: Secondary | ICD-10-CM | POA: Diagnosis present

## 2022-07-06 DIAGNOSIS — F1010 Alcohol abuse, uncomplicated: Secondary | ICD-10-CM | POA: Diagnosis present

## 2022-07-06 DIAGNOSIS — N320 Bladder-neck obstruction: Secondary | ICD-10-CM | POA: Diagnosis present

## 2022-07-06 DIAGNOSIS — N1330 Unspecified hydronephrosis: Secondary | ICD-10-CM | POA: Diagnosis not present

## 2022-07-06 DIAGNOSIS — R338 Other retention of urine: Secondary | ICD-10-CM | POA: Diagnosis present

## 2022-07-06 DIAGNOSIS — I160 Hypertensive urgency: Secondary | ICD-10-CM | POA: Diagnosis present

## 2022-07-06 DIAGNOSIS — E875 Hyperkalemia: Secondary | ICD-10-CM | POA: Diagnosis present

## 2022-07-06 DIAGNOSIS — B9562 Methicillin resistant Staphylococcus aureus infection as the cause of diseases classified elsewhere: Secondary | ICD-10-CM | POA: Diagnosis present

## 2022-07-06 DIAGNOSIS — T465X6A Underdosing of other antihypertensive drugs, initial encounter: Secondary | ICD-10-CM | POA: Diagnosis present

## 2022-07-06 DIAGNOSIS — I129 Hypertensive chronic kidney disease with stage 1 through stage 4 chronic kidney disease, or unspecified chronic kidney disease: Secondary | ICD-10-CM | POA: Diagnosis present

## 2022-07-06 DIAGNOSIS — Z91013 Allergy to seafood: Secondary | ICD-10-CM | POA: Diagnosis not present

## 2022-07-06 DIAGNOSIS — F17210 Nicotine dependence, cigarettes, uncomplicated: Secondary | ICD-10-CM | POA: Diagnosis present

## 2022-07-06 DIAGNOSIS — Z79899 Other long term (current) drug therapy: Secondary | ICD-10-CM | POA: Diagnosis not present

## 2022-07-06 DIAGNOSIS — I158 Other secondary hypertension: Secondary | ICD-10-CM | POA: Diagnosis present

## 2022-07-06 DIAGNOSIS — N401 Enlarged prostate with lower urinary tract symptoms: Secondary | ICD-10-CM | POA: Diagnosis present

## 2022-07-06 DIAGNOSIS — N136 Pyonephrosis: Secondary | ICD-10-CM | POA: Diagnosis present

## 2022-07-06 DIAGNOSIS — Z91148 Patient's other noncompliance with medication regimen for other reason: Secondary | ICD-10-CM | POA: Diagnosis not present

## 2022-07-06 DIAGNOSIS — R339 Retention of urine, unspecified: Secondary | ICD-10-CM

## 2022-07-06 LAB — URINALYSIS, ROUTINE W REFLEX MICROSCOPIC
Bilirubin Urine: NEGATIVE
Glucose, UA: NEGATIVE mg/dL
Ketones, ur: NEGATIVE mg/dL
Nitrite: POSITIVE — AB
Protein, ur: 30 mg/dL — AB
Specific Gravity, Urine: 1.02 (ref 1.005–1.030)
pH: 6.5 (ref 5.0–8.0)

## 2022-07-06 LAB — RENAL FUNCTION PANEL
Albumin: 3.2 g/dL — ABNORMAL LOW (ref 3.5–5.0)
Anion gap: 13 (ref 5–15)
BUN: 66 mg/dL — ABNORMAL HIGH (ref 6–20)
CO2: 14 mmol/L — ABNORMAL LOW (ref 22–32)
Calcium: 8.8 mg/dL — ABNORMAL LOW (ref 8.9–10.3)
Chloride: 102 mmol/L (ref 98–111)
Creatinine, Ser: 4.99 mg/dL — ABNORMAL HIGH (ref 0.61–1.24)
GFR, Estimated: 13 mL/min — ABNORMAL LOW (ref >60)
Glucose, Bld: 192 mg/dL — ABNORMAL HIGH (ref 70–99)
Phosphorus: 5.2 mg/dL — ABNORMAL HIGH (ref 2.5–4.6)
Potassium: 5.3 mmol/L — ABNORMAL HIGH (ref 3.5–5.1)
Sodium: 129 mmol/L — ABNORMAL LOW (ref 135–145)

## 2022-07-06 LAB — URINALYSIS, MICROSCOPIC (REFLEX): WBC, UA: >50 WBC/hpf (ref 0–5)

## 2022-07-06 MED ORDER — HEPARIN SODIUM (PORCINE) 5000 UNIT/ML IJ SOLN
5000.0000 [IU] | Freq: Three times a day (TID) | INTRAMUSCULAR | Status: DC
  Administered 2022-07-06 – 2022-07-08 (×8): 5000 [IU] via SUBCUTANEOUS
  Filled 2022-07-06 (×9): qty 1

## 2022-07-06 MED ORDER — HYDRALAZINE HCL 25 MG PO TABS
100.0000 mg | ORAL_TABLET | Freq: Three times a day (TID) | ORAL | Status: DC
  Administered 2022-07-06 – 2022-07-09 (×9): 100 mg via ORAL
  Filled 2022-07-06 (×2): qty 4
  Filled 2022-07-06 (×3): qty 2
  Filled 2022-07-06 (×4): qty 4
  Filled 2022-07-06: qty 2
  Filled 2022-07-06: qty 4
  Filled 2022-07-06: qty 2
  Filled 2022-07-06 (×3): qty 4
  Filled 2022-07-06 (×3): qty 2

## 2022-07-06 MED ORDER — METOPROLOL TARTRATE 5 MG/5ML IV SOLN
5.0000 mg | Freq: Once | INTRAVENOUS | Status: AC
  Administered 2022-07-06: 5 mg via INTRAVENOUS
  Filled 2022-07-06: qty 5

## 2022-07-06 MED ORDER — SODIUM ZIRCONIUM CYCLOSILICATE 10 G PO PACK
10.0000 g | PACK | Freq: Once | ORAL | Status: AC
  Administered 2022-07-06: 10 g via ORAL
  Filled 2022-07-06: qty 1

## 2022-07-06 MED ORDER — SODIUM CHLORIDE 0.9 % IV SOLN
1.0000 g | Freq: Once | INTRAVENOUS | Status: AC
  Administered 2022-07-06: 1 g via INTRAVENOUS
  Filled 2022-07-06: qty 10

## 2022-07-06 MED ORDER — SODIUM CHLORIDE 0.9 % IV SOLN
2.0000 g | INTRAVENOUS | Status: DC
  Administered 2022-07-07 – 2022-07-08 (×2): 2 g via INTRAVENOUS
  Filled 2022-07-06 (×2): qty 20

## 2022-07-06 MED ORDER — ACETAMINOPHEN 325 MG PO TABS
650.0000 mg | ORAL_TABLET | Freq: Four times a day (QID) | ORAL | Status: DC | PRN
  Administered 2022-07-07 – 2022-07-08 (×3): 650 mg via ORAL
  Filled 2022-07-06 (×3): qty 2

## 2022-07-06 MED ORDER — AMLODIPINE BESYLATE 5 MG PO TABS
5.0000 mg | ORAL_TABLET | Freq: Every day | ORAL | Status: DC
  Administered 2022-07-06 – 2022-07-09 (×4): 5 mg via ORAL
  Filled 2022-07-06 (×4): qty 1

## 2022-07-06 MED ORDER — ONDANSETRON HCL 4 MG PO TABS: 4.0000 mg | ORAL_TABLET | Freq: Four times a day (QID) | ORAL | Status: DC | PRN

## 2022-07-06 MED ORDER — SODIUM BICARBONATE 650 MG PO TABS
650.0000 mg | ORAL_TABLET | Freq: Two times a day (BID) | ORAL | Status: DC
  Administered 2022-07-06 – 2022-07-09 (×7): 650 mg via ORAL
  Filled 2022-07-06 (×7): qty 1

## 2022-07-06 MED ORDER — HYDROMORPHONE HCL 1 MG/ML IJ SOLN
0.5000 mg | INTRAMUSCULAR | Status: DC | PRN
  Administered 2022-07-06 – 2022-07-07 (×2): 1 mg via INTRAVENOUS
  Filled 2022-07-06 (×2): qty 1
  Filled 2022-07-06: qty 0.5

## 2022-07-06 MED ORDER — HYDRALAZINE HCL 20 MG/ML IJ SOLN: 10.0000 mg | INTRAMUSCULAR | Status: DC | PRN

## 2022-07-06 MED ORDER — LACTATED RINGERS IV SOLN: INTRAVENOUS | Status: DC

## 2022-07-06 MED ORDER — TAMSULOSIN HCL 0.4 MG PO CAPS
0.4000 mg | ORAL_CAPSULE | Freq: Every day | ORAL | Status: DC
  Administered 2022-07-06 – 2022-07-09 (×4): 0.4 mg via ORAL
  Filled 2022-07-06 (×4): qty 1

## 2022-07-06 MED ORDER — ONDANSETRON HCL 4 MG/2ML IJ SOLN: 4.0000 mg | Freq: Four times a day (QID) | INTRAMUSCULAR | Status: DC | PRN

## 2022-07-06 MED ORDER — ACETAMINOPHEN 650 MG RE SUPP: 650.0000 mg | Freq: Four times a day (QID) | RECTAL | Status: DC | PRN

## 2022-07-06 NOTE — ED Notes (Signed)
Patient transported to CT 

## 2022-07-06 NOTE — ED Provider Notes (Signed)
[facility] Provider Note   CSN: [id number] Arrival date & time: 07/05/22  2259     History  Chief Complaint  Patient presents with   Abdominal Pain    Wesley Sexton is a 52 y.o. male.  Patient is a 52 year old male presenting with complaints of abdominal pain.  This started earlier today.  He also describes fever.  The pain is noted mostly across the lower abdomen.  He denies any diarrhea or vomiting.  He does describe recent hospital admission for urinary retention and required a catheter.  He has been voiding, but feels like he is urinating less.  Pain is worse with palpation.  No alleviating factors.  The history is provided by the patient.       Home Medications Prior to Admission medications   Medication Sig Start Date End Date Taking? Authorizing Provider  amLODipine (NORVASC) 5 MG tablet Take 1 tablet (5 mg total) by mouth daily. 05/07/22 06/06/22  Darliss Cheney, MD  hydrALAZINE (APRESOLINE) 100 MG tablet Take 1 tablet (100 mg total) by mouth every 8 (eight) hours. 05/06/22 06/05/22  Darliss Cheney, MD      Allergies    Shellfish allergy    Review of Systems   Review of Systems  All other systems reviewed and are negative.   Physical Exam Updated Vital Signs BP 110/89   Pulse 97   Temp (P) 98.2 F (36.8 C) (Oral)   Resp 18   Ht 5\' 8"  (1.727 m)   Wt 72.6 kg   SpO2 100%   BMI 24.33 kg/m  Physical Exam Vitals and nursing note reviewed.  Constitutional:      General: He is not in acute distress.    Appearance: He is well-developed. He is not diaphoretic.  HENT:     Head: Normocephalic and atraumatic.  Cardiovascular:     Rate and Rhythm: Normal rate and regular rhythm.     Heart sounds: No murmur heard.    No friction rub.  Pulmonary:     Effort: Pulmonary effort is normal. No respiratory distress.     Breath sounds: Normal breath sounds. No wheezing or rales.  Abdominal:     General: Bowel sounds are normal.  There is no distension.     Palpations: Abdomen is soft.     Tenderness: There is abdominal tenderness in the right lower quadrant, suprapubic area and left lower quadrant. There is no right CVA tenderness, left CVA tenderness, guarding or rebound.  Musculoskeletal:        General: Normal range of motion.     Cervical back: Normal range of motion and neck supple.  Skin:    General: Skin is warm and dry.  Neurological:     Mental Status: He is alert and oriented to person, place, and time.     Coordination: Coordination normal.     ED Results / Procedures / Treatments   Labs (all labs ordered are listed, but only abnormal results are displayed) Labs Reviewed  COMPREHENSIVE METABOLIC PANEL - Abnormal; Notable for the following components:      Result Value   Sodium 131 (*)    Potassium 5.9 (*)    CO2 14 (*)    Glucose, Bld 114 (*)    BUN 61 (*)    Creatinine, Ser 5.01 (*)    Total Protein 9.2 (*)    GFR, Estimated 13 (*)    All other components within normal limits  CBC -  Abnormal; Notable for the following components:   WBC 20.4 (*)    RDW 16.3 (*)    Platelets 431 (*)    All other components within normal limits  LIPASE, BLOOD  URINALYSIS, ROUTINE W REFLEX MICROSCOPIC    EKG None  Radiology No results found.  Procedures Procedures    Medications Ordered in ED Medications - No data to display  ED Course/ Medical Decision Making/ A&P  Patient is a 52 year old male presenting with complaints of abdominal pain as described in the HPI.  Final Clinical Impression(s) / ED Diagnoses Final diagnoses:  None    Rx / DC Orders ED Discharge Orders     None

## 2022-07-06 NOTE — H&P (Signed)
History and Physical    Wesley Sexton [id number] DOB: 1971/04/09 DOA: 07/05/2022  PCP: Abran Richard, MD   Patient coming from: Home  Chief Complaint: Abdominal pain/fever  HPI: Wesley Sexton is a 52 y.o. male with medical history significant for hypertension, tobacco abuse, cocaine use, and alcohol abuse and likely CKD who presented to the ED with generalized abdominal pain as well as fever that began yesterday.  He denies any difficulty with urination, burning, or hematuria.  He denies any nausea, vomiting, or diarrhea.  He apparently has been noncompliant with his home blood pressure medications and has not taken them in the last 2-3 weeks.  He states that he drinks a glass of wine daily as well as smokes a pack of cigarettes and had used cocaine yesterday.   ED Course: Vital signs with elevated blood pressure readings noted and he was given some IV metoprolol without improvement.  He is noted to have leukocytosis of over 20,000 with potassium 5.9 and creatinine 5.01.  Urine analysis suggestive of UTI and he was started on Rocephin empirically.  CT scan demonstrating what appears to be significant bilateral hydro nephritis as well as cystitis and pyelonephritis.  Foley catheter placed in the ED with significant urine output.  Review of Systems: Reviewed as noted above, otherwise negative.  Past Medical History:  Diagnosis Date   Hypertension    Pancreatitis     Past Surgical History:  Procedure Laterality Date   "pin hole" in heart     LEG SURGERY       reports that he has been smoking cigarettes. He has been smoking an average of .5 packs per day. He has never used smokeless tobacco. He reports current alcohol use. He reports current drug use. Drugs: Cocaine and Marijuana.  Allergies  Allergen Reactions   Shellfish Allergy     History reviewed. No pertinent family history.  Prior to Admission medications   Medication Sig Start Date End Date Taking? Authorizing Provider   amLODipine (NORVASC) 5 MG tablet Take 1 tablet (5 mg total) by mouth daily. 05/07/22 07/06/22 Yes Pahwani, Einar Grad, MD  hydrALAZINE (APRESOLINE) 100 MG tablet Take 1 tablet (100 mg total) by mouth every 8 (eight) hours. 05/06/22 07/06/22 Yes Darliss Cheney, MD    Physical Exam: Vitals:   07/06/22 0500 07/06/22 0613 07/06/22 0720 07/06/22 0746  BP: (!) 156/103 (!) 153/106 (!) 164/103 (!) 160/108  Pulse: 100 100  87  Resp: 18 20 18    Temp:   (!) 97.5 F (36.4 C) 99 F (37.2 C)  TempSrc:   Oral Oral  SpO2: 100% 100% 100% 100%  Weight:      Height:        Constitutional: NAD, calm, comfortable Vitals:   07/06/22 0500 07/06/22 0613 07/06/22 0720 07/06/22 0746  BP: (!) 156/103 (!) 153/106 (!) 164/103 (!) 160/108  Pulse: 100 100  87  Resp: 18 20 18    Temp:   (!) 97.5 F (36.4 C) 99 F (37.2 C)  TempSrc:   Oral Oral  SpO2: 100% 100% 100% 100%  Weight:      Height:       Eyes: lids and conjunctivae normal Neck: normal, supple Respiratory: clear to auscultation bilaterally. Normal respiratory effort. No accessory muscle use.  Cardiovascular: Regular rate and rhythm, no murmurs. Abdomen: Mild tenderness to palpation throughout Musculoskeletal:  No edema. Skin: no rashes, lesions, ulcers.  Psychiatric: Flat affect Foley with some hematuria noted  Labs on Admission: I have personally reviewed following  labs and imaging studies  CBC: Recent Labs  Lab 07/05/22 2325  WBC 20.4*  HGB 13.2  HCT 41.5  MCV 85.9  PLT 99991111*   Basic Metabolic Panel: Recent Labs  Lab 07/05/22 2325  NA 131*  K 5.9*  CL 104  CO2 14*  GLUCOSE 114*  BUN 61*  CREATININE 5.01*  CALCIUM 9.2   GFR: Estimated Creatinine Clearance: 16.9 mL/min (A) (by C-G formula based on SCr of 5.01 mg/dL (H)). Liver Function Tests: Recent Labs  Lab 07/05/22 2325  AST 41  ALT 39  ALKPHOS 100  BILITOT 0.6  PROT 9.2*  ALBUMIN 3.7   Recent Labs  Lab 07/05/22 2325  LIPASE 31   No results for input(s):  "AMMONIA" in the last 168 hours. Coagulation Profile: No results for input(s): "INR", "PROTIME" in the last 168 hours. Cardiac Enzymes: No results for input(s): "CKTOTAL", "CKMB", "CKMBINDEX", "TROPONINI" in the last 168 hours. BNP (last 3 results) No results for input(s): "PROBNP" in the last 8760 hours. HbA1C: No results for input(s): "HGBA1C" in the last 72 hours. CBG: No results for input(s): "GLUCAP" in the last 168 hours. Lipid Profile: No results for input(s): "CHOL", "HDL", "LDLCALC", "TRIG", "CHOLHDL", "LDLDIRECT" in the last 72 hours. Thyroid Function Tests: No results for input(s): "TSH", "T4TOTAL", "FREET4", "T3FREE", "THYROIDAB" in the last 72 hours. Anemia Panel: No results for input(s): "VITAMINB12", "FOLATE", "FERRITIN", "TIBC", "IRON", "RETICCTPCT" in the last 72 hours. Urine analysis:    Component Value Date/Time   COLORURINE YELLOW 07/06/2022 0155   APPEARANCEUR CLOUDY (A) 07/06/2022 0155   LABSPEC 1.020 07/06/2022 0155   PHURINE 6.5 07/06/2022 0155   GLUCOSEU NEGATIVE 07/06/2022 0155   HGBUR SMALL (A) 07/06/2022 0155   BILIRUBINUR NEGATIVE 07/06/2022 0155   KETONESUR NEGATIVE 07/06/2022 0155   PROTEINUR 30 (A) 07/06/2022 0155   UROBILINOGEN 0.2 06/17/2014 1046   NITRITE POSITIVE (A) 07/06/2022 0155   LEUKOCYTESUR MODERATE (A) 07/06/2022 0155    Radiological Exams on Admission: CT ABDOMEN PELVIS WO CONTRAST  Result Date: 07/06/2022 CLINICAL DATA:  Abdominal pain and fever. EXAM: CT ABDOMEN AND PELVIS WITHOUT CONTRAST TECHNIQUE: Multidetector CT imaging of the abdomen and pelvis was performed following the standard protocol without IV contrast. RADIATION DOSE REDUCTION: This exam was performed according to the departmental dose-optimization program which includes automated exposure control, adjustment of the mA and/or kV according to patient size and/or use of iterative reconstruction technique. COMPARISON:  CT abdomen and pelvis without contrast 05/01/2022, CT  abdomen pelvis with contrast 07/06/2017. FINDINGS: Lower chest: No acute abnormality. Hepatobiliary: The liver is normal in size and appears mildly steatotic. No focal abnormality is seen without contrast. The gallbladder is contracted but grossly unremarkable. No calcified stones or biliary dilatation are seen. Pancreas: Unremarkable without contrast. Spleen: Unremarkable without contrast.  No splenomegaly. Adrenals/Urinary Tract: There is no adrenal mass. Symmetric severe bilateral hydroureteronephrosis again is shown. Correlate clinically for infectious complication. Bilateral perinephric stranding appears similar to the last CT. There is no evidence of urinary stones bilaterally. The bladder is somewhat distended with the dome up to the level of L4-5 and again demonstrates circumferential thickening up to 9-10 mm. There is no appreciable focal masslike thickening. Stomach/Bowel: There are chronic thickened folds of the stomach. The small bowel is within normal caliber limits with fluid filling in the mid to lower abdominal small bowel and scattered air and fluid in the normal caliber upper abdominal small bowel. Findings could be due to ileus or nonspecific enteritis. The appendix is retrocecal  and normal caliber. The large bowel wall is not well studied but no obvious wall thickening or inflammatory changes are seen. There are scattered uncomplicated diverticula. Vascular/Lymphatic: Aortic atherosclerosis. No enlarged abdominal or pelvic lymph nodes. Reproductive: Mild prostatomegaly, unchanged. Other: No free air, free fluid, free hemorrhage or incarcerated hernia. Musculoskeletal: No acute or significant osseous findings. IMPRESSION: 1. Symmetric severe bilateral hydroureteronephrosis with perinephric stranding, similar to the last CT. Correlate clinically for infectious complication. Urology consult recommended unless already done. 2. Distended urinary bladder with circumferential wall thickening which could  be due to chronic outlet obstruction or cystitis. There is mild prostatomegaly. 3. Chronic thickened folds of the stomach. Likely chronic gastritis. 4. Fluid filling in the mid to lower abdominal small bowel and scattered air and fluid in the upper abdominal small bowel. Findings could be due to ileus or nonspecific enteritis. No dilatation or inflammatory change. 5. Aortic atherosclerosis. 6. Diverticulosis without evidence of diverticulitis. 7. Mild hepatic steatosis. Aortic Atherosclerosis (ICD10-I70.0). Electronically Signed   By: Telford Nab M.D.   On: 07/06/2022 02:35     Assessment/Plan Principal Problem:   AKI (acute kidney injury) ([place]) Active Problems:   Obstructive uropathy   Bladder spasm   Bilateral low back pain   Hydronephrosis   Essential hypertension   Urinary retention   Gross hematuria    AKI on possible CKD secondary to urinary retention with hydronephrosis with concern for bladder outlet obstruction -Noted to have similar presentation January 2024 and left AMA at that time -Will resume Flomax and oxybutynin -Does not appear to be very compliant or have any outpatient nephrology follow-up, but does have proteinuria on UA; plan to consult with nephrology while inpatient  Bilateral pyelonephritis/cystitis -Agree with continuation of Rocephin and check urine cultures, obtained in ED.  Unfortunately, blood cultures were not obtained prior to antibiotics  Hyperkalemia with metabolic acidosis related to AKI -Plan to administer Lokelma and sodium bicarbonate tablets -Continue to monitor urine output -Consultation with nephrology as above  Hypertensive urgency with recent cocaine use -Noncompliant with his home blood pressure medications for the last 2-3 weeks -Check UDS/counseled on cessation -Continue home medications and monitor -IV hydralazine as needed  Tobacco abuse -Counseled on cessation  Alcohol abuse -Drinks glass of wine daily, monitor for withdrawal  symptoms  DVT prophylaxis: Heparin Code Status: Full Family Communication: None at bedside Disposition Plan: Admit for AKI/urinary obstruction with UTI Consults called:Urology, nephrology Admission status: Inpatient, Tele  Severity of Illness: The appropriate patient status for this patient is INPATIENT. Inpatient status is judged to be reasonable and necessary in order to provide the required intensity of service to ensure the patient's safety. The patient's presenting symptoms, physical exam findings, and initial radiographic and laboratory data in the context of their chronic comorbidities is felt to place them at high risk for further clinical deterioration. Furthermore, it is not anticipated that the patient will be medically stable for discharge from the hospital within 2 midnights of admission.   * I certify that at the point of admission it is my clinical judgment that the patient will require inpatient hospital care spanning beyond 2 midnights from the point of admission due to high intensity of service, high risk for further deterioration and high frequency of surveillance required.*   Yadira Hada D Hildur Bayer DO [facility]  If 7PM-7AM, please contact night-coverage [email or web address]  07/06/2022, 7:48 AM

## 2022-07-06 NOTE — Consult Note (Signed)
Reason for Consult: AKI Referring Physician:  Manuella Ghazi, DO  Wesley Sexton is an 52 y.o. male with a PMH significant for HTN, polysubstance abuse, and h/o urinary retention and AKI at hospitalization in January (left AMA and did not follow up with Urology) and had foley catheter removed on 05/31/22.  He presented to [facility] ED on 07/05/22 complaining of generalized abdominal pain and subjective fevers.  In the ED, temp 98.2, P 103, BP 135/100, SpO2 99%.  Labs were notable for Na 131, K 5.9, Co2 14, BUN 61, Cr 5.01, WBC 20.4, hgb 13.2, plt 431.  UA +blood, leukocytes, 30 mg/dL protein, nitrite +, many bacteria.  CT scan of abd/pelvis without contrast revealed severe bilateral hydroureteronephrosis with perinephric stranding.  Distended bladder with circumferential wall thickening.  Foley catheter was placed with 600 mL UOP.  We were consulted to further evaluate and manage his AKI.  Unknown baseline Scr since foley catheter was removed and last normal Scr was in January 2023.  Please see trend in Scr below.    He denies any N/V/D, dysuria, pyuria, hematuria, urgency, frequency, or retention.   Trend in Creatinine: Creatinine, Ser  Date/Time Value Ref Range Status  07/05/2022 11:25 PM 5.01 (H) 0.61 - 1.24 mg/dL Final  05/06/2022 03:38 AM 4.11 (H) 0.61 - 1.24 mg/dL Final  05/05/2022 03:39 AM 3.84 (H) 0.61 - 1.24 mg/dL Final  05/04/2022 03:26 AM 4.21 (H) 0.61 - 1.24 mg/dL Final  05/03/2022 04:31 AM 4.77 (H) 0.61 - 1.24 mg/dL Final  05/02/2022 10:36 AM 5.80 (H) 0.61 - 1.24 mg/dL Final  05/02/2022 06:45 AM 6.00 (H) 0.61 - 1.24 mg/dL Final  05/01/2022 09:18 PM 6.41 (H) 0.61 - 1.24 mg/dL Final  04/26/2021 12:02 PM 0.99 0.61 - 1.24 mg/dL Final  07/06/2017 01:00 PM 0.86 0.61 - 1.24 mg/dL Final  06/17/2014 10:45 AM 0.83 0.50 - 1.35 mg/dL Final    PMH:   Past Medical History:  Diagnosis Date   Hypertension    Pancreatitis     PSH:   Past Surgical History:  Procedure Laterality Date   "pin hole" in heart      LEG SURGERY      Allergies:  Allergies  Allergen Reactions   Shellfish Allergy     Medications:   Prior to Admission medications   Medication Sig Start Date End Date Taking? Authorizing Provider  amLODipine (NORVASC) 5 MG tablet Take 1 tablet (5 mg total) by mouth daily. 05/07/22 07/06/22 Yes Pahwani, Einar Grad, MD  hydrALAZINE (APRESOLINE) 100 MG tablet Take 1 tablet (100 mg total) by mouth every 8 (eight) hours. 05/06/22 07/06/22 Yes Darliss Cheney, MD    Inpatient medications:  amLODipine  5 mg Oral Daily   heparin  5,000 Units Subcutaneous Q8H   hydrALAZINE  100 mg Oral Q8H   sodium bicarbonate  650 mg Oral BID   tamsulosin  0.4 mg Oral Daily    Discontinued Meds:  There are no discontinued medications.  Social History:  reports that he has been smoking cigarettes. He has been smoking an average of .5 packs per day. He has never used smokeless tobacco. He reports current alcohol use. He reports current drug use. Drugs: Cocaine and Marijuana.  Family History:  History reviewed. No pertinent family history.  Pertinent items are noted in HPI. Weight change:   Intake/Output Summary (Last 24 hours) at 07/06/2022 1158 Last data filed at 07/06/2022 0951 Gross per 24 hour  Intake 340 ml  Output 700 ml  Net -360 ml  BP (!) 160/108 (BP Location: Left Arm)   Pulse 87   Temp 99 F (37.2 C) (Oral)   Resp 18   Ht 5\' 8"  (1.727 m)   Wt 72.6 kg   SpO2 100%   BMI 24.33 kg/m  Vitals:   07/06/22 0500 07/06/22 0613 07/06/22 0720 07/06/22 0746  BP: (!) 156/103 (!) 153/106 (!) 164/103 (!) 160/108  Pulse: 100 100  87  Resp: 18 20 18    Temp:   (!) 97.5 F (36.4 C) 99 F (37.2 C)  TempSrc:   Oral Oral  SpO2: 100% 100% 100% 100%  Weight:      Height:         General appearance: slowed mentation and just took pain meds Head: Normocephalic, without obvious abnormality, atraumatic Resp: clear to auscultation bilaterally Cardio: regular rate and rhythm, S1, S2 normal, no murmur, click,  rub or gallop GI: soft, non-tender; bowel sounds normal; no masses,  no organomegaly Extremities: extremities normal, atraumatic, no cyanosis or edema  Labs: Basic Metabolic Panel: Recent Labs  Lab 07/05/22 2325  NA 131*  K 5.9*  CL 104  CO2 14*  GLUCOSE 114*  BUN 61*  CREATININE 5.01*  ALBUMIN 3.7  CALCIUM 9.2   Liver Function Tests: Recent Labs  Lab 07/05/22 2325  AST 41  ALT 39  ALKPHOS 100  BILITOT 0.6  PROT 9.2*  ALBUMIN 3.7   Recent Labs  Lab 07/05/22 2325  LIPASE 31   No results for input(s): "AMMONIA" in the last 168 hours. CBC: Recent Labs  Lab 07/05/22 2325  WBC 20.4*  HGB 13.2  HCT 41.5  MCV 85.9  PLT 431*   PT/INR: @LABRCNTIP (inr:5) Cardiac Enzymes: )No results for input(s): "CKTOTAL", "CKMB", "CKMBINDEX", "TROPONINI" in the last 168 hours. CBG: No results for input(s): "GLUCAP" in the last 168 hours.  Iron Studies: No results for input(s): "IRON", "TIBC", "TRANSFERRIN", "FERRITIN" in the last 168 hours.  Xrays/Other Studies: CT ABDOMEN PELVIS WO CONTRAST  Result Date: 07/06/2022 CLINICAL DATA:  Abdominal pain and fever. EXAM: CT ABDOMEN AND PELVIS WITHOUT CONTRAST TECHNIQUE: Multidetector CT imaging of the abdomen and pelvis was performed following the standard protocol without IV contrast. RADIATION DOSE REDUCTION: This exam was performed according to the departmental dose-optimization program which includes automated exposure control, adjustment of the mA and/or kV according to patient size and/or use of iterative reconstruction technique. COMPARISON:  CT abdomen and pelvis without contrast 05/01/2022, CT abdomen pelvis with contrast 07/06/2017. FINDINGS: Lower chest: No acute abnormality. Hepatobiliary: The liver is normal in size and appears mildly steatotic. No focal abnormality is seen without contrast. The gallbladder is contracted but grossly unremarkable. No calcified stones or biliary dilatation are seen. Pancreas: Unremarkable without  contrast. Spleen: Unremarkable without contrast.  No splenomegaly. Adrenals/Urinary Tract: There is no adrenal mass. Symmetric severe bilateral hydroureteronephrosis again is shown. Correlate clinically for infectious complication. Bilateral perinephric stranding appears similar to the last CT. There is no evidence of urinary stones bilaterally. The bladder is somewhat distended with the dome up to the level of L4-5 and again demonstrates circumferential thickening up to 9-10 mm. There is no appreciable focal masslike thickening. Stomach/Bowel: There are chronic thickened folds of the stomach. The small bowel is within normal caliber limits with fluid filling in the mid to lower abdominal small bowel and scattered air and fluid in the normal caliber upper abdominal small bowel. Findings could be due to ileus or nonspecific enteritis. The appendix is retrocecal and normal caliber. The large  bowel wall is not well studied but no obvious wall thickening or inflammatory changes are seen. There are scattered uncomplicated diverticula. Vascular/Lymphatic: Aortic atherosclerosis. No enlarged abdominal or pelvic lymph nodes. Reproductive: Mild prostatomegaly, unchanged. Other: No free air, free fluid, free hemorrhage or incarcerated hernia. Musculoskeletal: No acute or significant osseous findings. IMPRESSION: 1. Symmetric severe bilateral hydroureteronephrosis with perinephric stranding, similar to the last CT. Correlate clinically for infectious complication. Urology consult recommended unless already done. 2. Distended urinary bladder with circumferential wall thickening which could be due to chronic outlet obstruction or cystitis. There is mild prostatomegaly. 3. Chronic thickened folds of the stomach. Likely chronic gastritis. 4. Fluid filling in the mid to lower abdominal small bowel and scattered air and fluid in the upper abdominal small bowel. Findings could be due to ileus or nonspecific enteritis. No dilatation  or inflammatory change. 5. Aortic atherosclerosis. 6. Diverticulosis without evidence of diverticulitis. 7. Mild hepatic steatosis. Aortic Atherosclerosis (ICD10-I70.0). Electronically Signed   By: Telford Nab M.D.   On: 07/06/2022 02:35     Assessment/Plan:  AKI due to obstructive uropathy - Unclear baseline Scr since he was diagnosed in January 2024 with obstruction but left AMA and had foley catheter removed on 05/31/22 without rechecking BUN/Cr at that time.  Will need urology evaluation and possible bilateral stents or chronic indwelling foley catheter.  No uremic symptoms or indication for dialysis at this time.  Continue to follow I's/o's and Scr.  Will likely have some residual CKD given ongoing obstruction.  - will recheck labs today as none sent this morning. Avoid nephrotoxic medications including NSAIDs and iodinated intravenous contrast exposure unless the latter is absolutely indicated.   Preferred narcotic agents for pain control are hydromorphone, fentanyl, and methadone. Morphine should not be used.  Avoid Baclofen and avoid oral sodium phosphate and magnesium citrate based laxatives / bowel preps.  Continue strict Input and Output monitoring.  Will monitor the patient closely with you and intervene or adjust therapy as indicated by changes in clinical status/labs   Hyperkalemia - due to #1.  Given lokelma and NaHCO3 tabs.  Recheck labs today. Obstructive uropathy - possible BOO.  Recommend Urology evaluation.  Continue with flomax and Foley catheter for now. Bilateral pyelonephritis/cystitis - on rocephin.  Urine cultures pending. HTN urgency with recent cocaine use - resume home meds and stress compliance. Polysubstance abuse - per primary Metabolic Acidosis - due to #1.   Wesley Sexton 07/06/2022, 11:58 AM

## 2022-07-06 NOTE — Consult Note (Signed)
Urology Consult  Referring physician: Dr. Manuella Ghazi Reason for referral: Urinary retention  Chief Complaint: Difficulty Urinating  History of Present Illness: Wesley Sexton is a 52yo with a history of substance abuse and prior history of urinary retention who presented to the ER with a 1-2 day history of abdominal pain and fevers. He had developed difficulty urinating over the past 4 days and had not urinated for 24 hours prior to presenting to the ER. He has a history of urinary retention and AKI in January but left AMA and did not followup with Urology. He denies any prior BPH meds. Recently he has been noncompliant with his BOP meds. At baseline he has moderate LUTS including a weak urinary stream, nocturia 1-3x, occasional urinary hesitancy and occasional straining to urinate. He denies any history of dysuria or gross hematuria. CT obtained in the ER showed a thick walled distended bladder with severe bilateral hydronephrosis.Foley catheter was placed in the ER. Since foley placement he has had mild hematuria. Urine microscopy concerning for UTI.   Past Medical History:  Diagnosis Date   Hypertension    Pancreatitis    Past Surgical History:  Procedure Laterality Date   "pin hole" in heart     LEG SURGERY      Medications: I have reviewed the patient's current medications. Allergies:  Allergies  Allergen Reactions   Shellfish Allergy     History reviewed. No pertinent family history. Social History:  reports that he has been smoking cigarettes. He has been smoking an average of .5 packs per day. He has never used smokeless tobacco. He reports current alcohol use. He reports current drug use. Drugs: Cocaine and Marijuana.  Review of Systems  Constitutional:  Positive for fever.  Genitourinary:  Positive for decreased urine volume, difficulty urinating, frequency and urgency.    Physical Exam:  Vital signs in last 24 hours: Temp:  [97.5 F (36.4 C)-99 F (37.2 C)] 98.6 F (37 C)  (03/20 1608) Pulse Rate:  [72-103] 80 (03/20 1608) Resp:  [18-20] 18 (03/20 0720) BP: (110-164)/(62-108) 129/62 (03/20 1608) SpO2:  [99 %-100 %] 100 % (03/20 1608) Weight:  [72.6 kg] 72.6 kg (03/19 2310) Physical Exam Vitals reviewed.  Constitutional:      Appearance: He is well-developed.  HENT:     Head: Normocephalic and atraumatic.  Eyes:     Extraocular Movements: Extraocular movements intact.     Pupils: Pupils are equal, round, and reactive to light.  Cardiovascular:     Rate and Rhythm: Normal rate and regular rhythm.  Pulmonary:     Effort: Pulmonary effort is normal. No respiratory distress.  Abdominal:     General: Abdomen is flat. There is no distension.  Genitourinary:    Penis: Normal.      Testes: Normal.     Prostate: Enlarged.  Skin:    General: Skin is warm and dry.  Neurological:     General: No focal deficit present.     Mental Status: He is alert and oriented to person, place, and time.  Psychiatric:        Mood and Affect: Mood normal.        Behavior: Behavior normal.     Laboratory Data:  Results for orders placed or performed during the hospital encounter of 07/05/22 (from the past 72 hour(s))  Lipase, blood     Status: None   Collection Time: 07/05/22 11:25 PM  Result Value Ref Range   Lipase 31 11 - 51 U/L  Comment: Performed at [facility], [address], [place], [place] [address]  Comprehensive metabolic panel     Status: Abnormal   Collection Time: 07/05/22 11:25 PM  Result Value Ref Range   Sodium 131 (L) 135 - 145 mmol/L   Potassium 5.9 (H) 3.5 - 5.1 mmol/L   Chloride 104 98 - 111 mmol/L   CO2 14 (L) 22 - 32 mmol/L   Glucose, Bld 114 (H) 70 - 99 mg/dL    Comment: Glucose reference range applies only to samples taken after fasting for at least 8 hours.   BUN 61 (H) 6 - 20 mg/dL   Creatinine, Ser 5.01 (H) 0.61 - 1.24 mg/dL   Calcium 9.2 8.9 - 10.3 mg/dL   Total Protein 9.2 (H) 6.5 - 8.1 g/dL   Albumin 3.7 3.5 - 5.0 g/dL   AST  41 15 - 41 U/L   ALT 39 0 - 44 U/L   Alkaline Phosphatase 100 38 - 126 U/L   Total Bilirubin 0.6 0.3 - 1.2 mg/dL   GFR, Estimated 13 (L) >60 mL/min    Comment: (NOTE) Calculated using the CKD-EPI Creatinine Equation (2021)    Anion gap 13 5 - 15    Comment: Performed at [facility], [address] [place], [place] [address]  CBC     Status: Abnormal   Collection Time: 07/05/22 11:25 PM  Result Value Ref Range   WBC 20.4 (H) 4.0 - 10.5 K/uL   RBC 4.83 4.22 - 5.81 MIL/uL   Hemoglobin 13.2 13.0 - 17.0 g/dL   HCT 41.5 39.0 - 52.0 %   MCV 85.9 80.0 - 100.0 fL   MCH 27.3 26.0 - 34.0 pg   MCHC 31.8 30.0 - 36.0 g/dL   RDW 16.3 (H) 11.5 - 15.5 %   Platelets 431 (H) 150 - 400 K/uL   nRBC 0.0 0.0 - 0.2 %    Comment: Performed at [facility], [address] [place], [place] [address]  Urinalysis, Routine w reflex microscopic -Urine, Clean Catch     Status: Abnormal   Collection Time: 07/06/22  1:55 AM  Result Value Ref Range   Color, Urine YELLOW YELLOW   APPearance CLOUDY (A) CLEAR   Specific Gravity, Urine 1.020 1.005 - 1.030   pH 6.5 5.0 - 8.0   Glucose, UA NEGATIVE NEGATIVE mg/dL   Hgb urine dipstick SMALL (A) NEGATIVE   Bilirubin Urine NEGATIVE NEGATIVE   Ketones, ur NEGATIVE NEGATIVE mg/dL   Protein, ur 30 (A) NEGATIVE mg/dL   Nitrite POSITIVE (A) NEGATIVE   Leukocytes,Ua MODERATE (A) NEGATIVE    Comment: Performed at [facility], [address] [place], [place] [address]  Urinalysis, Microscopic (reflex)     Status: Abnormal   Collection Time: 07/06/22  1:55 AM  Result Value Ref Range   RBC / HPF 0-5 0 - 5 RBC/hpf   WBC, UA >50 0 - 5 WBC/hpf   Bacteria, UA MANY (A) NONE SEEN   Squamous Epithelial / HPF 0-5 0 - 5 /HPF   WBC Clumps PRESENT     Comment: Performed at [facility], [address] [place], [place] [address]  Culture, blood (Routine X 2) w Reflex to ID Panel     Status: None (Preliminary result)   Collection Time: 07/06/22  8:26 AM   Specimen: Left  Antecubital; Blood  Result Value Ref Range   Specimen Description LEFT ANTECUBITAL    Special Requests      BOTTLES DRAWN AEROBIC AND  ANAEROBIC Blood Culture adequate volume Performed at [facility], [address], [place], [place] [address]    Culture PENDING    Report Status PENDING   Culture, blood (Routine X 2) w Reflex to ID Panel     Status: None (Preliminary result)   Collection Time: 07/06/22  8:26 AM   Specimen: BLOOD RIGHT HAND  Result Value Ref Range   Specimen Description BLOOD RIGHT HAND    Special Requests      BOTTLES DRAWN AEROBIC ONLY Blood Culture results may not be optimal due to an inadequate volume of blood received in culture bottles Performed at [facility], [address] [place], [place] [address]    Culture PENDING    Report Status PENDING   Renal function panel     Status: Abnormal   Collection Time: 07/06/22  1:08 PM  Result Value Ref Range   Sodium 129 (L) 135 - 145 mmol/L   Potassium 5.3 (H) 3.5 - 5.1 mmol/L   Chloride 102 98 - 111 mmol/L   CO2 14 (L) 22 - 32 mmol/L   Glucose, Bld 192 (H) 70 - 99 mg/dL    Comment: Glucose reference range applies only to samples taken after fasting for at least 8 hours.   BUN 66 (H) 6 - 20 mg/dL   Creatinine, Ser 4.99 (H) 0.61 - 1.24 mg/dL   Calcium 8.8 (L) 8.9 - 10.3 mg/dL   Phosphorus 5.2 (H) 2.5 - 4.6 mg/dL   Albumin 3.2 (L) 3.5 - 5.0 g/dL   GFR, Estimated 13 (L) >60 mL/min    Comment: (NOTE) Calculated using the CKD-EPI Creatinine Equation (2021)    Anion gap 13 5 - 15    Comment: Performed at [facility], [address] [place], [place] [address]   Recent Results (from the past 240 hour(s))  Culture, blood (Routine X 2) w Reflex to ID Panel     Status: None (Preliminary result)   Collection Time: 07/06/22  8:26 AM   Specimen: Left Antecubital; Blood  Result Value Ref Range Status   Specimen Description LEFT ANTECUBITAL  Final   Special Requests   Final    BOTTLES DRAWN AEROBIC AND ANAEROBIC Blood  Culture adequate volume Performed at [facility], [address], [place], [place] [address]    Culture PENDING  Incomplete   Report Status PENDING  Incomplete  Culture, blood (Routine X 2) w Reflex to ID Panel     Status: None (Preliminary result)   Collection Time: 07/06/22  8:26 AM   Specimen: BLOOD RIGHT HAND  Result Value Ref Range Status   Specimen Description BLOOD RIGHT HAND  Final   Special Requests   Final    BOTTLES DRAWN AEROBIC ONLY Blood Culture results may not be optimal due to an inadequate volume of blood received in culture bottles Performed at [facility], [address] [place], [place] [address]    Culture PENDING  Incomplete   Report Status PENDING  Incomplete   Creatinine: Recent Labs    07/05/22 2325 07/06/22 1308  CREATININE 5.01* 4.99*   Baseline Creatinine: unknwon  Impression/Assessment:  51yo with BPH with urinary retention, bilateral hydronephrosis and UTI  Plan:  1 UTI: Please continue broad spectrum antibiotics pending his urine culture. He will require 1 week of culture specific antibiotics for his UTI 2. Bilateral hydronephrosis: I discussed the natural history of hydronephrosis with the patient and the various causes. His hydronephrosis is related to chronic bladder outlet obstruction and should improve/resolve  with the indwelling foley 3. BPH with urinary retention: We discussed the management of his BPH including medical therapy, chronic indwelling foley, CIC, Rezum, Urolift, TURP and simple prostatectomy. After discussing the options the patient has elected to proceed with Medical therapy. Please continue flomax 0.4mg  daily. His foley should remain in place for 2 weeks and he should followup in my office with a voiding trial .    Wesley Sexton 07/06/2022, 8:43 PM

## 2022-07-06 NOTE — Progress Notes (Signed)
  Transition of Care [facility]) Screening Note   Patient Details  Name: Wesley Sexton Date of Birth: 12-02-1970   Transition of Care [facility]) CM/SW Contact:    Ihor Gully, LCSW Phone Number: 07/06/2022, 10:57 AM    Transition of Care Department [facility]) has reviewed patient and no TOC needs have been identified at this time. We will continue to monitor patient advancement through interdisciplinary progression rounds. If new patient transition needs arise, please place a TOC consult.

## 2022-07-07 DIAGNOSIS — N179 Acute kidney failure, unspecified: Secondary | ICD-10-CM | POA: Diagnosis not present

## 2022-07-07 LAB — COMPREHENSIVE METABOLIC PANEL
ALT: 30 U/L (ref 0–44)
AST: 36 U/L (ref 15–41)
Albumin: 2.7 g/dL — ABNORMAL LOW (ref 3.5–5.0)
Alkaline Phosphatase: 72 U/L (ref 38–126)
Anion gap: 12 (ref 5–15)
BUN: 68 mg/dL — ABNORMAL HIGH (ref 6–20)
CO2: 17 mmol/L — ABNORMAL LOW (ref 22–32)
Calcium: 8.6 mg/dL — ABNORMAL LOW (ref 8.9–10.3)
Chloride: 104 mmol/L (ref 98–111)
Creatinine, Ser: 4.37 mg/dL — ABNORMAL HIGH (ref 0.61–1.24)
GFR, Estimated: 16 mL/min — ABNORMAL LOW (ref >60)
Glucose, Bld: 102 mg/dL — ABNORMAL HIGH (ref 70–99)
Potassium: 5.1 mmol/L (ref 3.5–5.1)
Sodium: 133 mmol/L — ABNORMAL LOW (ref 135–145)
Total Bilirubin: 0.5 mg/dL (ref 0.3–1.2)
Total Protein: 7.2 g/dL (ref 6.5–8.1)

## 2022-07-07 LAB — CBC
HCT: 36 % — ABNORMAL LOW (ref 39.0–52.0)
Hemoglobin: 11.8 g/dL — ABNORMAL LOW (ref 13.0–17.0)
MCH: 27.4 pg (ref 26.0–34.0)
MCHC: 32.8 g/dL (ref 30.0–36.0)
MCV: 83.5 fL (ref 80.0–100.0)
Platelets: 414 10*3/uL — ABNORMAL HIGH (ref 150–400)
RBC: 4.31 MIL/uL (ref 4.22–5.81)
RDW: 15.9 % — ABNORMAL HIGH (ref 11.5–15.5)
WBC: 18.6 10*3/uL — ABNORMAL HIGH (ref 4.0–10.5)
nRBC: 0.1 % (ref 0.0–0.2)

## 2022-07-07 LAB — MAGNESIUM: Magnesium: 1.6 mg/dL — ABNORMAL LOW (ref 1.7–2.4)

## 2022-07-07 MED ORDER — CHLORHEXIDINE GLUCONATE CLOTH 2 % EX PADS
6.0000 | MEDICATED_PAD | Freq: Every day | CUTANEOUS | Status: DC
  Administered 2022-07-08 – 2022-07-09 (×3): 6 via TOPICAL

## 2022-07-07 MED ORDER — MAGNESIUM SULFATE IN D5W 1-5 GM/100ML-% IV SOLN
1.0000 g | Freq: Once | INTRAVENOUS | Status: AC
  Administered 2022-07-07: 1 g via INTRAVENOUS
  Filled 2022-07-07: qty 100

## 2022-07-07 NOTE — Progress Notes (Signed)
PROGRESS NOTE    Wesley Sexton  [id number] DOB: [birth year] DOA: 07/05/2022 PCP: Abran Richard, MD   Brief Narrative:    Wesley Sexton is a 52 y.o. male with medical history significant for hypertension, tobacco abuse, cocaine use, and alcohol abuse and likely CKD who presented to the ED with generalized abdominal pain as well as fever that began on the day prior to admission.  He was admitted with AKI in the setting of urinary retention and hydronephrosis along with bilateral pyelonephritis/cystitis.  Assessment & Plan:   Principal Problem:   AKI (acute kidney injury) ([place]) Active Problems:   Obstructive uropathy   Bladder spasm   Bilateral low back pain   Hydronephrosis   Essential hypertension   Urinary retention   Gross hematuria  Assessment and Plan:   AKI secondary to urinary retention with hydronephrosis with concern for bladder outlet obstruction-slowly improving -Noted to have similar presentation January 2024 and left AMA at that time -Will resume Flomax and oxybutynin -Appreciate ongoing nephrology recommendations -Urology recommending continuation of Flomax with plan to keep on Foley catheter for 2 weeks and follow-up outpatient for void trial   Bilateral pyelonephritis/cystitis -Agree with continuation of Rocephin and urine cultures with no growth.  Unfortunately, blood cultures were not obtained prior to antibiotics   Mild hypomagnesemia -Replete and reevaluate in a.m.   Hypertensive urgency with recent cocaine use -Noncompliant with his home blood pressure medications for the last 2-3 weeks -Check UDS/counseled on cessation -Continue home medications and monitor -IV hydralazine as needed   Tobacco abuse -Counseled on cessation   Alcohol abuse -Drinks glass of wine daily, monitor for withdrawal symptoms   DVT prophylaxis: Heparin Code Status: Full Family Communication: None at bedside Disposition Plan:  Status is: Inpatient Remains inpatient  appropriate because: Need for IV medications.  Consultants:  Nephrology Urology  Procedures:  None  Antimicrobials:     Subjective: Patient seen and evaluated today with no new acute complaints or concerns. No acute concerns or events noted overnight.  Objective: Vitals:   07/06/22 1311 07/06/22 1608 07/06/22 2156 07/07/22 0438  BP: 130/88 129/62 116/84 117/73  Pulse: 72 80 86 83  Resp:   18 18  Temp: 98.3 F (36.8 C) 98.6 F (37 C) 99.1 F (37.3 C) 98.9 F (37.2 C)  TempSrc: Oral Oral Oral Oral  SpO2: 100% 100% 99% 97%  Weight:      Height:        Intake/Output Summary (Last 24 hours) at 07/07/2022 1153 Last data filed at 07/07/2022 0738 Gross per 24 hour  Intake 480 ml  Output 2500 ml  Net -2020 ml   Filed Weights   07/05/22 2310  Weight: 72.6 kg    Examination:  General exam: Appears calm and comfortable  Respiratory system: Clear to auscultation. Respiratory effort normal. Cardiovascular system: S1 & S2 heard, RRR.  Gastrointestinal system: Abdomen is soft Central nervous system: Alert and awake Extremities: No edema Skin: No significant lesions noted Psychiatry: Flat affect. Foley with clear, yellow urine output noted    Data Reviewed: I have personally reviewed following labs and imaging studies  CBC: Recent Labs  Lab 07/05/22 2325 07/07/22 0502  WBC 20.4* 18.6*  HGB 13.2 11.8*  HCT 41.5 36.0*  MCV 85.9 83.5  PLT 431* [id number]*   Basic Metabolic Panel: Recent Labs  Lab 07/05/22 2325 07/06/22 1308 07/07/22 0502  NA 131* 129* 133*  K 5.9* 5.3* 5.1  CL 104 102 104  CO2 14* 14* 17*  GLUCOSE 114* 192* 102*  BUN 61* 66* 68*  CREATININE 5.01* 4.99* 4.37*  CALCIUM 9.2 8.8* 8.6*  MG  --   --  1.6*  PHOS  --  5.2*  --    GFR: Estimated Creatinine Clearance: 19.3 mL/min (A) (by C-G formula based on SCr of 4.37 mg/dL (H)). Liver Function Tests: Recent Labs  Lab 07/05/22 2325 07/06/22 1308 07/07/22 0502  AST 41  --  36  ALT 39  --   30  ALKPHOS 100  --  72  BILITOT 0.6  --  0.5  PROT 9.2*  --  7.2  ALBUMIN 3.7 3.2* 2.7*   Recent Labs  Lab 07/05/22 2325  LIPASE 31   No results for input(s): "AMMONIA" in the last 168 hours. Coagulation Profile: No results for input(s): "INR", "PROTIME" in the last 168 hours. Cardiac Enzymes: No results for input(s): "CKTOTAL", "CKMB", "CKMBINDEX", "TROPONINI" in the last 168 hours. BNP (last 3 results) No results for input(s): "PROBNP" in the last 8760 hours. HbA1C: No results for input(s): "HGBA1C" in the last 72 hours. CBG: No results for input(s): "GLUCAP" in the last 168 hours. Lipid Profile: No results for input(s): "CHOL", "HDL", "LDLCALC", "TRIG", "CHOLHDL", "LDLDIRECT" in the last 72 hours. Thyroid Function Tests: No results for input(s): "TSH", "T4TOTAL", "FREET4", "T3FREE", "THYROIDAB" in the last 72 hours. Anemia Panel: No results for input(s): "VITAMINB12", "FOLATE", "FERRITIN", "TIBC", "IRON", "RETICCTPCT" in the last 72 hours. Sepsis Labs: No results for input(s): "PROCALCITON", "LATICACIDVEN" in the last 168 hours.  Recent Results (from the past 240 hour(s))  Urine Culture     Status: None (Preliminary result)   Collection Time: 07/06/22  1:55 AM   Specimen: Urine, Catheterized  Result Value Ref Range Status   Specimen Description   Final    URINE, CATHETERIZED Performed at [facility], [address] [place]  [address]    Special Requests   Final    NONE Performed at [facility], [address], [place], [place] [address]    Culture   Final    CULTURE REINCUBATED FOR BETTER GROWTH Performed at [facility], [place] [address] [place], [place] [address]    Report Status PENDING  Incomplete  Culture, blood (Routine X 2) w Reflex to ID Panel     Status: None (Preliminary result)   Collection Time: 07/06/22  8:26 AM   Specimen: Left Antecubital; Blood  Result Value Ref Range Status   Specimen Description LEFT ANTECUBITAL  Final    Special Requests   Final    BOTTLES DRAWN AEROBIC AND ANAEROBIC Blood Culture adequate volume   Culture   Final    NO GROWTH < 24 HOURS Performed at [facility], [address], [place], [place] [address]    Report Status PENDING  Incomplete  Culture, blood (Routine X 2) w Reflex to ID Panel     Status: None (Preliminary result)   Collection Time: 07/06/22  8:26 AM   Specimen: BLOOD RIGHT HAND  Result Value Ref Range Status   Specimen Description BLOOD RIGHT HAND  Final   Special Requests   Final    BOTTLES DRAWN AEROBIC ONLY Blood Culture results may not be optimal due to an inadequate volume of blood received in culture bottles   Culture   Final    NO GROWTH < 24 HOURS Performed at [facility], [address], [place], [place] [address]    Report Status PENDING  Incomplete  Radiology Studies: CT ABDOMEN PELVIS WO CONTRAST  Result Date: 07/06/2022 CLINICAL DATA:  Abdominal pain and fever. EXAM: CT ABDOMEN AND PELVIS WITHOUT CONTRAST TECHNIQUE: Multidetector CT imaging of the abdomen and pelvis was performed following the standard protocol without IV contrast. RADIATION DOSE REDUCTION: This exam was performed according to the departmental dose-optimization program which includes automated exposure control, adjustment of the mA and/or kV according to patient size and/or use of iterative reconstruction technique. COMPARISON:  CT abdomen and pelvis without contrast 05/01/2022, CT abdomen pelvis with contrast 07/06/2017. FINDINGS: Lower chest: No acute abnormality. Hepatobiliary: The liver is normal in size and appears mildly steatotic. No focal abnormality is seen without contrast. The gallbladder is contracted but grossly unremarkable. No calcified stones or biliary dilatation are seen. Pancreas: Unremarkable without contrast. Spleen: Unremarkable without contrast.  No splenomegaly. Adrenals/Urinary Tract: There is no adrenal mass. Symmetric severe bilateral hydroureteronephrosis  again is shown. Correlate clinically for infectious complication. Bilateral perinephric stranding appears similar to the last CT. There is no evidence of urinary stones bilaterally. The bladder is somewhat distended with the dome up to the level of L4-5 and again demonstrates circumferential thickening up to 9-10 mm. There is no appreciable focal masslike thickening. Stomach/Bowel: There are chronic thickened folds of the stomach. The small bowel is within normal caliber limits with fluid filling in the mid to lower abdominal small bowel and scattered air and fluid in the normal caliber upper abdominal small bowel. Findings could be due to ileus or nonspecific enteritis. The appendix is retrocecal and normal caliber. The large bowel wall is not well studied but no obvious wall thickening or inflammatory changes are seen. There are scattered uncomplicated diverticula. Vascular/Lymphatic: Aortic atherosclerosis. No enlarged abdominal or pelvic lymph nodes. Reproductive: Mild prostatomegaly, unchanged. Other: No free air, free fluid, free hemorrhage or incarcerated hernia. Musculoskeletal: No acute or significant osseous findings. IMPRESSION: 1. Symmetric severe bilateral hydroureteronephrosis with perinephric stranding, similar to the last CT. Correlate clinically for infectious complication. Urology consult recommended unless already done. 2. Distended urinary bladder with circumferential wall thickening which could be due to chronic outlet obstruction or cystitis. There is mild prostatomegaly. 3. Chronic thickened folds of the stomach. Likely chronic gastritis. 4. Fluid filling in the mid to lower abdominal small bowel and scattered air and fluid in the upper abdominal small bowel. Findings could be due to ileus or nonspecific enteritis. No dilatation or inflammatory change. 5. Aortic atherosclerosis. 6. Diverticulosis without evidence of diverticulitis. 7. Mild hepatic steatosis. Aortic Atherosclerosis  (ICD10-I70.0). Electronically Signed   By: Telford Nab M.D.   On: 07/06/2022 02:35        Scheduled Meds:  amLODipine  5 mg Oral Daily   heparin  5,000 Units Subcutaneous Q8H   hydrALAZINE  100 mg Oral Q8H   sodium bicarbonate  650 mg Oral BID   tamsulosin  0.4 mg Oral Daily   Continuous Infusions:  cefTRIAXone (ROCEPHIN)  IV 2 g (07/07/22 0516)   lactated ringers 100 mL/hr at 07/06/22 2246   magnesium sulfate bolus IVPB       LOS: 1 day    Time spent: 35 minutes    Dawson Hollman Darleen Crocker, DO [facility]  If 7PM-7AM, please contact night-coverage [email or web address] 07/07/2022, 11:53 AM

## 2022-07-07 NOTE — Progress Notes (Signed)
Pt A&O x 4 this shift, very pleasant. Pt ambulatory to and from the bathroom without assistance. Pt denies any discomfort at this time, plan of care is ongoing.

## 2022-07-07 NOTE — Progress Notes (Signed)
[facility] Progress Note   Assessment/ Plan:    AKI due to obstructive uropathy - Unclear baseline Scr since he was diagnosed in January 2024 with obstruction but left AMA and had foley catheter removed on 05/31/22. - Foley in place - Cr improving- unclear how much improvement we will get given chronicity - keep on IVFs today- watch for post-obstructive diuresis Hyperkalemia - due to #1.  Given lokelma and NaHCO3 tabs, improving Obstructive uropathy - BOO, urology following Continue with flomax and Foley catheter for now. Bilateral pyelonephritis/cystitis - on rocephin.  Urine cultures pending. HTN urgency with recent cocaine use - resume home meds and stress compliance. Polysubstance abuse - per primary Metabolic Acidosis - due to #1.  Subjective:    Seen in room.  Foley in place, good UOP.  Cr coming down, K normalized.     Objective:   BP 117/73 (BP Location: Right Arm)   Pulse 83   Temp 98.9 F (37.2 C) (Oral)   Resp 18   Ht 5\' 8"  (1.727 m)   Wt 72.6 kg   SpO2 97%   BMI 24.33 kg/m   Intake/Output Summary (Last 24 hours) at 07/07/2022 1042 Last data filed at 07/07/2022 [id number] Gross per 24 hour  Intake 480 ml  Output 2500 ml  Net -2020 ml   Weight change:   Physical Exam: Gen:NAD CVS:RRR Resp: clear Abd: soft GU: Foley in place, clear yellow urine Ext:no LE edema  Imaging: CT ABDOMEN PELVIS WO CONTRAST  Result Date: 07/06/2022 CLINICAL DATA:  Abdominal pain and fever. EXAM: CT ABDOMEN AND PELVIS WITHOUT CONTRAST TECHNIQUE: Multidetector CT imaging of the abdomen and pelvis was performed following the standard protocol without IV contrast. RADIATION DOSE REDUCTION: This exam was performed according to the departmental dose-optimization program which includes automated exposure control, adjustment of the mA and/or kV according to patient size and/or use of iterative reconstruction technique. COMPARISON:  CT abdomen and pelvis without contrast 05/01/2022,  CT abdomen pelvis with contrast 07/06/2017. FINDINGS: Lower chest: No acute abnormality. Hepatobiliary: The liver is normal in size and appears mildly steatotic. No focal abnormality is seen without contrast. The gallbladder is contracted but grossly unremarkable. No calcified stones or biliary dilatation are seen. Pancreas: Unremarkable without contrast. Spleen: Unremarkable without contrast.  No splenomegaly. Adrenals/Urinary Tract: There is no adrenal mass. Symmetric severe bilateral hydroureteronephrosis again is shown. Correlate clinically for infectious complication. Bilateral perinephric stranding appears similar to the last CT. There is no evidence of urinary stones bilaterally. The bladder is somewhat distended with the dome up to the level of L4-5 and again demonstrates circumferential thickening up to 9-10 mm. There is no appreciable focal masslike thickening. Stomach/Bowel: There are chronic thickened folds of the stomach. The small bowel is within normal caliber limits with fluid filling in the mid to lower abdominal small bowel and scattered air and fluid in the normal caliber upper abdominal small bowel. Findings could be due to ileus or nonspecific enteritis. The appendix is retrocecal and normal caliber. The large bowel wall is not well studied but no obvious wall thickening or inflammatory changes are seen. There are scattered uncomplicated diverticula. Vascular/Lymphatic: Aortic atherosclerosis. No enlarged abdominal or pelvic lymph nodes. Reproductive: Mild prostatomegaly, unchanged. Other: No free air, free fluid, free hemorrhage or incarcerated hernia. Musculoskeletal: No acute or significant osseous findings. IMPRESSION: 1. Symmetric severe bilateral hydroureteronephrosis with perinephric stranding, similar to the last CT. Correlate clinically for infectious complication. Urology consult recommended unless already done. 2. Distended urinary bladder  with circumferential wall thickening which  could be due to chronic outlet obstruction or cystitis. There is mild prostatomegaly. 3. Chronic thickened folds of the stomach. Likely chronic gastritis. 4. Fluid filling in the mid to lower abdominal small bowel and scattered air and fluid in the upper abdominal small bowel. Findings could be due to ileus or nonspecific enteritis. No dilatation or inflammatory change. 5. Aortic atherosclerosis. 6. Diverticulosis without evidence of diverticulitis. 7. Mild hepatic steatosis. Aortic Atherosclerosis (ICD10-I70.0). Electronically Signed   By: Telford Nab M.D.   On: 07/06/2022 02:35    Labs: BMET Recent Labs  Lab 07/05/22 2325 07/06/22 1308 07/07/22 0502  NA 131* 129* 133*  K 5.9* 5.3* 5.1  CL 104 102 104  CO2 14* 14* 17*  GLUCOSE 114* 192* 102*  BUN 61* 66* 68*  CREATININE 5.01* 4.99* 4.37*  CALCIUM 9.2 8.8* 8.6*  PHOS  --  5.2*  --    CBC Recent Labs  Lab 07/05/22 2325 07/07/22 0502  WBC 20.4* 18.6*  HGB 13.2 11.8*  HCT 41.5 36.0*  MCV 85.9 83.5  PLT 431* 414*    Medications:     amLODipine  5 mg Oral Daily   heparin  5,000 Units Subcutaneous Q8H   hydrALAZINE  100 mg Oral Q8H   sodium bicarbonate  650 mg Oral BID   tamsulosin  0.4 mg Oral Daily    Madelon Lips MD 07/07/2022, 10:42 AM

## 2022-07-08 DIAGNOSIS — N179 Acute kidney failure, unspecified: Secondary | ICD-10-CM | POA: Diagnosis not present

## 2022-07-08 LAB — CBC
HCT: 34.2 % — ABNORMAL LOW (ref 39.0–52.0)
Hemoglobin: 10.9 g/dL — ABNORMAL LOW (ref 13.0–17.0)
MCH: 26.7 pg (ref 26.0–34.0)
MCHC: 31.9 g/dL (ref 30.0–36.0)
MCV: 83.8 fL (ref 80.0–100.0)
Platelets: 417 10*3/uL — ABNORMAL HIGH (ref 150–400)
RBC: 4.08 MIL/uL — ABNORMAL LOW (ref 4.22–5.81)
RDW: 15.4 % (ref 11.5–15.5)
WBC: 11 10*3/uL — ABNORMAL HIGH (ref 4.0–10.5)
nRBC: 0 % (ref 0.0–0.2)

## 2022-07-08 LAB — BASIC METABOLIC PANEL
Anion gap: 10 (ref 5–15)
BUN: 64 mg/dL — ABNORMAL HIGH (ref 6–20)
CO2: 18 mmol/L — ABNORMAL LOW (ref 22–32)
Calcium: 8.2 mg/dL — ABNORMAL LOW (ref 8.9–10.3)
Chloride: 102 mmol/L (ref 98–111)
Creatinine, Ser: 3.52 mg/dL — ABNORMAL HIGH (ref 0.61–1.24)
GFR, Estimated: 20 mL/min — ABNORMAL LOW (ref >60)
Glucose, Bld: 133 mg/dL — ABNORMAL HIGH (ref 70–99)
Potassium: 4.1 mmol/L (ref 3.5–5.1)
Sodium: 130 mmol/L — ABNORMAL LOW (ref 135–145)

## 2022-07-08 LAB — URINE CULTURE: Culture: 60000 — AB

## 2022-07-08 LAB — MAGNESIUM: Magnesium: 1.8 mg/dL (ref 1.7–2.4)

## 2022-07-08 MED ORDER — SULFAMETHOXAZOLE-TRIMETHOPRIM 800-160 MG PO TABS
0.5000 | ORAL_TABLET | Freq: Two times a day (BID) | ORAL | Status: DC
  Administered 2022-07-08 – 2022-07-09 (×3): 0.5 via ORAL
  Filled 2022-07-08 (×3): qty 1

## 2022-07-08 MED ORDER — SODIUM CHLORIDE 0.9 % IV SOLN: INTRAVENOUS | Status: DC

## 2022-07-08 MED ORDER — SULFAMETHOXAZOLE-TRIMETHOPRIM 800-160 MG PO TABS: 1.0000 | ORAL_TABLET | Freq: Every day | ORAL | Status: DC

## 2022-07-08 NOTE — Progress Notes (Signed)
[facility] Progress Note   Assessment/ Plan:    AKI due to obstructive uropathy - Unclear baseline Scr since he was diagnosed in January 2024 with obstruction but left AMA and had foley catheter removed on 05/31/22. - Foley in place - Cr improving- unclear how much improvement we will get given chronicity - off IVFs- if pt can keep up PO intake with his UOP, OK to go from renal perspective.  Will need followup in our [place] office too Hyperkalemia - due to #1.  Given lokelma and NaHCO3 tabs, improving Obstructive uropathy - BOO, urology following Continue with flomax and Foley catheter for now. Bilateral pyelonephritis/cystitis - on rocephin.  Urine cultures pending. HTN urgency with recent cocaine use - resume home meds and stress compliance. Polysubstance abuse - per primary Metabolic Acidosis - due to #1.  Subjective:    Cr improved with IVFs, robust urine output.     Objective:   BP 110/69 (BP Location: Left Arm)   Pulse 98   Temp 98.4 F (36.9 C) (Oral)   Resp 12   Ht 5\' 8"  (1.727 m)   Wt 72.6 kg   SpO2 97%   BMI 24.33 kg/m   Intake/Output Summary (Last 24 hours) at 07/08/2022 [id number] Last data filed at 07/08/2022 0300 Gross per 24 hour  Intake 1911.3 ml  Output 3800 ml  Net -1888.7 ml   Weight change:   Physical Exam: Gen:NAD CVS:RRR Resp: clear Abd: soft GU: Foley in place, clear yellow urine Ext:no LE edema  Imaging: No results found.  Labs: BMET Recent Labs  Lab 07/05/22 2325 07/06/22 1308 07/07/22 0502 07/08/22 0501  NA 131* 129* 133* 130*  K 5.9* 5.3* 5.1 4.1  CL 104 102 104 102  CO2 14* 14* 17* 18*  GLUCOSE 114* 192* 102* 133*  BUN 61* 66* 68* 64*  CREATININE 5.01* 4.99* 4.37* 3.52*  CALCIUM 9.2 8.8* 8.6* 8.2*  PHOS  --  5.2*  --   --    CBC Recent Labs  Lab 07/05/22 2325 07/07/22 0502 07/08/22 0501  WBC 20.4* 18.6* 11.0*  HGB 13.2 11.8* 10.9*  HCT 41.5 36.0* 34.2*  MCV 85.9 83.5 83.8  PLT 431* 414* 417*     Medications:     amLODipine  5 mg Oral Daily   Chlorhexidine Gluconate Cloth  6 each Topical Daily   heparin  5,000 Units Subcutaneous Q8H   hydrALAZINE  100 mg Oral Q8H   sodium bicarbonate  650 mg Oral BID   tamsulosin  0.4 mg Oral Daily    Madelon Lips MD 07/08/2022, 9:14 AM

## 2022-07-08 NOTE — Progress Notes (Signed)
PROGRESS NOTE    Wesley Sexton  [id number] DOB: 1970/12/08 DOA: 07/05/2022 PCP: Abran Richard, MD   Brief Narrative:    Wesley Sexton is a 52 y.o. male with medical history significant for hypertension, tobacco abuse, cocaine use, and alcohol abuse and likely CKD who presented to the ED with generalized abdominal pain as well as fever that began on the day prior to admission.  He was admitted with AKI in the setting of urinary retention and hydronephrosis along with bilateral pyelonephritis/cystitis.  Assessment & Plan:   Principal Problem:   AKI (acute kidney injury) ([place]) Active Problems:   Obstructive uropathy   Bladder spasm   Bilateral low back pain   Hydronephrosis   Essential hypertension   Urinary retention   Gross hematuria  Assessment and Plan:   AKI secondary to urinary retention with hydronephrosis with concern for bladder outlet obstruction-slowly improving -Noted to have similar presentation January 2024 and left AMA at that time -Will resume Flomax and oxybutynin -Appreciate ongoing nephrology recommendations -Urology recommending continuation of Flomax with plan to keep on Foley catheter for 2 weeks and follow-up outpatient for void trial   MRSA bilateral pyelonephritis/cystitis -Transition to oral Bactrim once daily and monitor creatinine levels to see if this is well-tolerated over the next 1-2 days.  Otherwise will require use of Zyvox for total 10 days. -Discontinue Rocephin   Hypertensive urgency with recent cocaine use-resolved -Noncompliant with his home blood pressure medications for the last 2-3 weeks -Check UDS/counseled on cessation -Continue home medications and monitor -IV hydralazine as needed   Tobacco abuse -Counseled on cessation   Alcohol abuse -Drinks glass of wine daily, monitor for withdrawal symptoms   DVT prophylaxis: Heparin Code Status: Full Family Communication: None at bedside Disposition Plan:  Status is:  Inpatient Remains inpatient appropriate because: Need for IV medications.  Consultants:  Nephrology Urology  Procedures:  None  Antimicrobials:  Anti-infectives (From admission, onward)    Start     Dose/Rate Route Frequency Ordered Stop   07/08/22 1115  sulfamethoxazole-trimethoprim (BACTRIM DS) 800-160 MG per tablet 1 tablet        1 tablet Oral Daily 07/08/22 1020     07/07/22 0600  cefTRIAXone (ROCEPHIN) 2 g in sodium chloride 0.9 % 100 mL IVPB  Status:  Discontinued        2 g 200 mL/hr over 30 Minutes Intravenous Every 24 hours 07/06/22 0751 07/08/22 1020   07/06/22 0300  cefTRIAXone (ROCEPHIN) 1 g in sodium chloride 0.9 % 100 mL IVPB        1 g 200 mL/hr over 30 Minutes Intravenous  Once 07/06/22 0251 07/06/22 0353       Subjective: Patient seen and evaluated today with no new acute complaints or concerns. No acute concerns or events noted overnight.  Objective: Vitals:   07/07/22 2125 07/07/22 2310 07/08/22 0439 07/08/22 0820  BP: 105/66 118/70 104/61 110/69  Pulse: 88 82 90 98  Resp: 20 20 16 12   Temp: 98.9 F (37.2 C)  98.6 F (37 C) 98.4 F (36.9 C)  TempSrc: Oral  Oral Oral  SpO2: 98% 98% 97% 97%  Weight:      Height:        Intake/Output Summary (Last 24 hours) at 07/08/2022 1028 Last data filed at 07/08/2022 0300 Gross per 24 hour  Intake 1911.3 ml  Output 3800 ml  Net -1888.7 ml   Filed Weights   07/05/22 2310  Weight: 72.6 kg    Examination:  General exam: Appears calm and comfortable  Respiratory system: Clear to auscultation. Respiratory effort normal. Cardiovascular system: S1 & S2 heard, RRR.  Gastrointestinal system: Abdomen is soft Central nervous system: Alert and awake Extremities: No edema Skin: No significant lesions noted Psychiatry: Flat affect. Foley with clear, yellow urine output noted    Data Reviewed: I have personally reviewed following labs and imaging studies  CBC: Recent Labs  Lab 07/05/22 2325  07/07/22 0502 07/08/22 0501  WBC 20.4* 18.6* 11.0*  HGB 13.2 11.8* 10.9*  HCT 41.5 36.0* 34.2*  MCV 85.9 83.5 83.8  PLT 431* 414* [id number]*   Basic Metabolic Panel: Recent Labs  Lab 07/05/22 2325 07/06/22 1308 07/07/22 0502 07/08/22 0501  NA 131* 129* 133* 130*  K 5.9* 5.3* 5.1 4.1  CL 104 102 104 102  CO2 14* 14* 17* 18*  GLUCOSE 114* 192* 102* 133*  BUN 61* 66* 68* 64*  CREATININE 5.01* 4.99* 4.37* 3.52*  CALCIUM 9.2 8.8* 8.6* 8.2*  MG  --   --  1.6* 1.8  PHOS  --  5.2*  --   --    GFR: Estimated Creatinine Clearance: 24 mL/min (A) (by C-G formula based on SCr of 3.52 mg/dL (H)). Liver Function Tests: Recent Labs  Lab 07/05/22 2325 07/06/22 1308 07/07/22 0502  AST 41  --  36  ALT 39  --  30  ALKPHOS 100  --  72  BILITOT 0.6  --  0.5  PROT 9.2*  --  7.2  ALBUMIN 3.7 3.2* 2.7*   Recent Labs  Lab 07/05/22 2325  LIPASE 31   No results for input(s): "AMMONIA" in the last 168 hours. Coagulation Profile: No results for input(s): "INR", "PROTIME" in the last 168 hours. Cardiac Enzymes: No results for input(s): "CKTOTAL", "CKMB", "CKMBINDEX", "TROPONINI" in the last 168 hours. BNP (last 3 results) No results for input(s): "PROBNP" in the last 8760 hours. HbA1C: No results for input(s): "HGBA1C" in the last 72 hours. CBG: No results for input(s): "GLUCAP" in the last 168 hours. Lipid Profile: No results for input(s): "CHOL", "HDL", "LDLCALC", "TRIG", "CHOLHDL", "LDLDIRECT" in the last 72 hours. Thyroid Function Tests: No results for input(s): "TSH", "T4TOTAL", "FREET4", "T3FREE", "THYROIDAB" in the last 72 hours. Anemia Panel: No results for input(s): "VITAMINB12", "FOLATE", "FERRITIN", "TIBC", "IRON", "RETICCTPCT" in the last 72 hours. Sepsis Labs: No results for input(s): "PROCALCITON", "LATICACIDVEN" in the last 168 hours.  Recent Results (from the past 240 hour(s))  Urine Culture     Status: Abnormal   Collection Time: 07/06/22  1:55 AM   Specimen: Urine,  Catheterized  Result Value Ref Range Status   Specimen Description   Final    URINE, CATHETERIZED Performed at [facility], [address], [place], [place] [address]    Special Requests   Final    NONE Performed at [facility], [address] [place], [place] [address]    Culture (A)  Final    60,000 COLONIES/mL METHICILLIN RESISTANT STAPHYLOCOCCUS AUREUS   Report Status 07/08/2022 FINAL  Final   Organism ID, Bacteria METHICILLIN RESISTANT STAPHYLOCOCCUS AUREUS (A)  Final      Susceptibility   Methicillin resistant staphylococcus aureus - MIC*    CIPROFLOXACIN <=0.5 SENSITIVE Sensitive     GENTAMICIN <=0.5 SENSITIVE Sensitive     NITROFURANTOIN <=16 SENSITIVE Sensitive     OXACILLIN >=4 RESISTANT Resistant     TETRACYCLINE <=1 SENSITIVE Sensitive     VANCOMYCIN 1 SENSITIVE Sensitive     TRIMETH/SULFA <=10  SENSITIVE Sensitive     CLINDAMYCIN <=0.25 SENSITIVE Sensitive     RIFAMPIN <=0.5 SENSITIVE Sensitive     Inducible Clindamycin NEGATIVE Sensitive     * 60,000 COLONIES/mL METHICILLIN RESISTANT STAPHYLOCOCCUS AUREUS  Culture, blood (Routine X 2) w Reflex to ID Panel     Status: None (Preliminary result)   Collection Time: 07/06/22  8:26 AM   Specimen: Left Antecubital; Blood  Result Value Ref Range Status   Specimen Description LEFT ANTECUBITAL  Final   Special Requests   Final    BOTTLES DRAWN AEROBIC AND ANAEROBIC Blood Culture adequate volume   Culture   Final    NO GROWTH 2 DAYS Performed at [facility], [address] [place], [place] [address]    Report Status PENDING  Incomplete  Culture, blood (Routine X 2) w Reflex to ID Panel     Status: None (Preliminary result)   Collection Time: 07/06/22  8:26 AM   Specimen: BLOOD RIGHT HAND  Result Value Ref Range Status   Specimen Description BLOOD RIGHT HAND  Final   Special Requests   Final    BOTTLES DRAWN AEROBIC ONLY Blood Culture results may not be optimal due to an inadequate volume of blood received in culture  bottles   Culture   Final    NO GROWTH 2 DAYS Performed at [facility], [address] [place],  [address]    Report Status PENDING  Incomplete         Radiology Studies: No results found.      Scheduled Meds:  amLODipine  5 mg Oral Daily   Chlorhexidine Gluconate Cloth  6 each Topical Daily   heparin  5,000 Units Subcutaneous Q8H   hydrALAZINE  100 mg Oral Q8H   sodium bicarbonate  650 mg Oral BID   sulfamethoxazole-trimethoprim  1 tablet Oral Daily   tamsulosin  0.4 mg Oral Daily   Continuous Infusions:  sodium chloride       LOS: 2 days    Time spent: 35 minutes    Kimothy Kishimoto Darleen Crocker, DO [facility]  If 7PM-7AM, please contact night-coverage [email or web address] 07/08/2022, 10:28 AM

## 2022-07-09 DIAGNOSIS — N179 Acute kidney failure, unspecified: Secondary | ICD-10-CM | POA: Diagnosis not present

## 2022-07-09 LAB — CBC
HCT: 34 % — ABNORMAL LOW (ref 39.0–52.0)
Hemoglobin: 10.8 g/dL — ABNORMAL LOW (ref 13.0–17.0)
MCH: 27 pg (ref 26.0–34.0)
MCHC: 31.8 g/dL (ref 30.0–36.0)
MCV: 85 fL (ref 80.0–100.0)
Platelets: 443 10*3/uL — ABNORMAL HIGH (ref 150–400)
RBC: 4 MIL/uL — ABNORMAL LOW (ref 4.22–5.81)
RDW: 15.6 % — ABNORMAL HIGH (ref 11.5–15.5)
WBC: 7.8 10*3/uL (ref 4.0–10.5)
nRBC: 0 % (ref 0.0–0.2)

## 2022-07-09 LAB — BASIC METABOLIC PANEL
Anion gap: 10 (ref 5–15)
BUN: 49 mg/dL — ABNORMAL HIGH (ref 6–20)
CO2: 19 mmol/L — ABNORMAL LOW (ref 22–32)
Calcium: 8.2 mg/dL — ABNORMAL LOW (ref 8.9–10.3)
Chloride: 104 mmol/L (ref 98–111)
Creatinine, Ser: 3.06 mg/dL — ABNORMAL HIGH (ref 0.61–1.24)
GFR, Estimated: 24 mL/min — ABNORMAL LOW (ref >60)
Glucose, Bld: 138 mg/dL — ABNORMAL HIGH (ref 70–99)
Potassium: 4.2 mmol/L (ref 3.5–5.1)
Sodium: 133 mmol/L — ABNORMAL LOW (ref 135–145)

## 2022-07-09 LAB — MAGNESIUM: Magnesium: 1.6 mg/dL — ABNORMAL LOW (ref 1.7–2.4)

## 2022-07-09 MED ORDER — AMLODIPINE BESYLATE 5 MG PO TABS: 5.0000 mg | ORAL_TABLET | Freq: Every day | ORAL | 0 refills | Status: DC

## 2022-07-09 MED ORDER — SODIUM BICARBONATE 650 MG PO TABS: 650.0000 mg | ORAL_TABLET | Freq: Two times a day (BID) | ORAL | 0 refills | Status: AC

## 2022-07-09 MED ORDER — MAGNESIUM SULFATE 2 GM/50ML IV SOLN
2.0000 g | Freq: Once | INTRAVENOUS | Status: AC
  Administered 2022-07-09: 2 g via INTRAVENOUS
  Filled 2022-07-09: qty 50

## 2022-07-09 MED ORDER — TAMSULOSIN HCL 0.4 MG PO CAPS: 0.4000 mg | ORAL_CAPSULE | Freq: Every day | ORAL | 1 refills | Status: AC

## 2022-07-09 MED ORDER — HYDRALAZINE HCL 100 MG PO TABS: 100.0000 mg | ORAL_TABLET | Freq: Three times a day (TID) | ORAL | 0 refills | Status: DC

## 2022-07-09 MED ORDER — SULFAMETHOXAZOLE-TRIMETHOPRIM 800-160 MG PO TABS: 0.5000 | ORAL_TABLET | Freq: Two times a day (BID) | ORAL | 0 refills | Status: AC

## 2022-07-09 NOTE — Progress Notes (Signed)
Brief Nephrology Note  Obstructive AKI improving and good UOP. Labs overall improved. Unclear where Crt would settle out but only time will tell. We will sign off and patient should f/u outpatient for ongoing monitoring.

## 2022-07-09 NOTE — Discharge Summary (Signed)
Physician Discharge Summary  Wesley Sexton [id number] DOB: 18-Aug-1970 DOA: 07/05/2022  PCP: Abran Richard, MD  Admit date: 07/05/2022  Discharge date: 07/09/2022  Admitted From:Home  Disposition:  Home  Recommendations for Outpatient Follow-up:  Follow up with PCP in 1-2 weeks Please obtain BMP/CBC in one week Remain on sodium bicarbonate tablets as prescribed for metabolic acidosis Home blood pressure medications refilled as noted below Continue on Bactrim as prescribed for 7 more days for MRSA UTI/pyelonephritis for total 10-day course Continue Foley catheter for 1-2 weeks until further follow-up with outpatient urology for voiding trial Continue Flomax as prescribed Follow-up with nephrology outpatient with referral sent to monitor renal function Counseled on cessation of alcohol and cocaine use  Home Health: None  Equipment/Devices: Foley catheter  Discharge Condition:Stable  CODE STATUS: Full  Diet recommendation: Heart Healthy  Brief/Interim Summary:  Wesley Sexton is a 52 y.o. male with medical history significant for hypertension, tobacco abuse, cocaine use, and alcohol abuse and likely CKD who presented to the ED with generalized abdominal pain as well as fever that began on the day prior to admission.  He was admitted with AKI in the setting of urinary retention and hydronephrosis along with bilateral pyelonephritis/cystitis.  His urine output is brisk and his creatinine levels continue to trend downwards.  Urine cultures did demonstrate findings of MRSA and he has been transition to Bactrim which she appears to be tolerating adequately.  He will need to continue with his Foley catheter for another 2 weeks and then be seen for voiding trial outpatient with urology.  Urology and nephrology have evaluated while inpatient and he is now in stable condition for discharge.  Discharge Diagnoses:  Principal Problem:   AKI (acute kidney injury) ([place]) Active Problems:    Obstructive uropathy   Bladder spasm   Bilateral low back pain   Hydronephrosis   Essential hypertension   Urinary retention   Gross hematuria  Principal discharge diagnosis: AKI secondary to bladder outlet obstruction with bilateral hydronephrosis along with MRSA bilateral pyelonephritis/cystitis.  Discharge Instructions  Discharge Instructions     Ambulatory referral to Nephrology   Complete by: As directed    Ambulatory referral to Urology   Complete by: As directed    Diet - low sodium heart healthy   Complete by: As directed    Increase activity slowly   Complete by: As directed       Allergies as of 07/09/2022       Reactions   Shellfish Allergy         Medication List     TAKE these medications    amLODipine 5 MG tablet Commonly known as: NORVASC Take 1 tablet (5 mg total) by mouth daily.   hydrALAZINE 100 MG tablet Commonly known as: APRESOLINE Take 1 tablet (100 mg total) by mouth every 8 (eight) hours.   sodium bicarbonate 650 MG tablet Take 1 tablet (650 mg total) by mouth 2 (two) times daily.   sulfamethoxazole-trimethoprim 800-160 MG tablet Commonly known as: BACTRIM DS Take 0.5 tablets by mouth every 12 (twelve) hours for 7 days.   tamsulosin 0.4 MG Caps capsule Commonly known as: FLOMAX Take 1 capsule (0.4 mg total) by mouth daily.        Allergies  Allergen Reactions   Shellfish Allergy     Consultations: Nephrology Urology   Procedures/Studies: CT ABDOMEN PELVIS WO CONTRAST  Result Date: 07/06/2022 CLINICAL DATA:  Abdominal pain and fever. EXAM: CT ABDOMEN AND PELVIS WITHOUT CONTRAST TECHNIQUE: Multidetector  CT imaging of the abdomen and pelvis was performed following the standard protocol without IV contrast. RADIATION DOSE REDUCTION: This exam was performed according to the departmental dose-optimization program which includes automated exposure control, adjustment of the mA and/or kV according to patient size and/or use of  iterative reconstruction technique. COMPARISON:  CT abdomen and pelvis without contrast 05/01/2022, CT abdomen pelvis with contrast 07/06/2017. FINDINGS: Lower chest: No acute abnormality. Hepatobiliary: The liver is normal in size and appears mildly steatotic. No focal abnormality is seen without contrast. The gallbladder is contracted but grossly unremarkable. No calcified stones or biliary dilatation are seen. Pancreas: Unremarkable without contrast. Spleen: Unremarkable without contrast.  No splenomegaly. Adrenals/Urinary Tract: There is no adrenal mass. Symmetric severe bilateral hydroureteronephrosis again is shown. Correlate clinically for infectious complication. Bilateral perinephric stranding appears similar to the last CT. There is no evidence of urinary stones bilaterally. The bladder is somewhat distended with the dome up to the level of L4-5 and again demonstrates circumferential thickening up to 9-10 mm. There is no appreciable focal masslike thickening. Stomach/Bowel: There are chronic thickened folds of the stomach. The small bowel is within normal caliber limits with fluid filling in the mid to lower abdominal small bowel and scattered air and fluid in the normal caliber upper abdominal small bowel. Findings could be due to ileus or nonspecific enteritis. The appendix is retrocecal and normal caliber. The large bowel wall is not well studied but no obvious wall thickening or inflammatory changes are seen. There are scattered uncomplicated diverticula. Vascular/Lymphatic: Aortic atherosclerosis. No enlarged abdominal or pelvic lymph nodes. Reproductive: Mild prostatomegaly, unchanged. Other: No free air, free fluid, free hemorrhage or incarcerated hernia. Musculoskeletal: No acute or significant osseous findings. IMPRESSION: 1. Symmetric severe bilateral hydroureteronephrosis with perinephric stranding, similar to the last CT. Correlate clinically for infectious complication. Urology consult  recommended unless already done. 2. Distended urinary bladder with circumferential wall thickening which could be due to chronic outlet obstruction or cystitis. There is mild prostatomegaly. 3. Chronic thickened folds of the stomach. Likely chronic gastritis. 4. Fluid filling in the mid to lower abdominal small bowel and scattered air and fluid in the upper abdominal small bowel. Findings could be due to ileus or nonspecific enteritis. No dilatation or inflammatory change. 5. Aortic atherosclerosis. 6. Diverticulosis without evidence of diverticulitis. 7. Mild hepatic steatosis. Aortic Atherosclerosis (ICD10-I70.0). Electronically Signed   By: Telford Nab M.D.   On: 07/06/2022 02:35     Discharge Exam: Vitals:   07/09/22 0518 07/09/22 0847  BP: 128/78 132/67  Pulse: 86 92  Resp: 18 16  Temp:    SpO2:  97%   Vitals:   07/08/22 1330 07/08/22 2109 07/09/22 0518 07/09/22 0847  BP: (!) 177/68 139/75 128/78 132/67  Pulse: 85 88 86 92  Resp: 14 16 18 16   Temp: 98.7 F (37.1 C) 98.5 F (36.9 C)    TempSrc: Oral     SpO2: 99% 98%  97%  Weight:      Height:        General: Pt is alert, awake, not in acute distress Cardiovascular: RRR, S1/S2 +, no rubs, no gallops Respiratory: CTA bilaterally, no wheezing, no rhonchi Abdominal: Soft, NT, ND, bowel sounds + Extremities: no edema, no cyanosis Foley with clear, yellow urine output    The results of significant diagnostics from this hospitalization (including imaging, microbiology, ancillary and laboratory) are listed below for reference.     Microbiology: Recent Results (from the past 240 hour(s))  Urine  Culture     Status: Abnormal   Collection Time: 07/06/22  1:55 AM   Specimen: Urine, Catheterized  Result Value Ref Range Status   Specimen Description   Final    URINE, CATHETERIZED Performed at [facility], [address] [place], [place] [address]    Special Requests   Final    NONE Performed at [facility], [address], [place], [place] [address]    Culture (A)  Final    60,000 COLONIES/mL METHICILLIN RESISTANT STAPHYLOCOCCUS AUREUS   Report Status 07/08/2022 FINAL  Final   Organism ID, Bacteria METHICILLIN RESISTANT STAPHYLOCOCCUS AUREUS (A)  Final      Susceptibility   Methicillin resistant staphylococcus aureus - MIC*    CIPROFLOXACIN <=0.5 SENSITIVE Sensitive     GENTAMICIN <=0.5 SENSITIVE Sensitive     NITROFURANTOIN <=16 SENSITIVE Sensitive     OXACILLIN >=4 RESISTANT Resistant     TETRACYCLINE <=1 SENSITIVE Sensitive     VANCOMYCIN 1 SENSITIVE Sensitive     TRIMETH/SULFA <=10 SENSITIVE Sensitive     CLINDAMYCIN <=0.25 SENSITIVE Sensitive     RIFAMPIN <=0.5 SENSITIVE Sensitive     Inducible Clindamycin NEGATIVE Sensitive     * 60,000 COLONIES/mL METHICILLIN RESISTANT STAPHYLOCOCCUS AUREUS  Culture, blood (Routine X 2) w Reflex to ID Panel     Status: None (Preliminary result)   Collection Time: 07/06/22  8:26 AM   Specimen: Left Antecubital; Blood  Result Value Ref Range Status   Specimen Description LEFT ANTECUBITAL  Final   Special Requests   Final    BOTTLES DRAWN AEROBIC AND ANAEROBIC Blood Culture adequate volume   Culture   Final    NO GROWTH 3 DAYS Performed at [facility], [address], [place], [place] [address]    Report Status PENDING  Incomplete  Culture, blood (Routine X 2) w Reflex to ID Panel     Status: None (Preliminary result)   Collection Time: 07/06/22  8:26 AM   Specimen: BLOOD RIGHT HAND  Result Value Ref Range Status   Specimen Description BLOOD RIGHT HAND  Final   Special Requests   Final    BOTTLES DRAWN AEROBIC ONLY Blood Culture results may not be optimal due to an inadequate volume of blood received in culture bottles   Culture   Final    NO GROWTH 3 DAYS Performed at [facility], [address] [place], [place] [address]    Report Status PENDING  Incomplete     Labs: BNP (last 3 results) No results for input(s): "BNP" in the last 8760  hours. Basic Metabolic Panel: Recent Labs  Lab 07/05/22 2325 07/06/22 1308 07/07/22 0502 07/08/22 0501 07/09/22 0531  NA 131* 129* 133* 130* 133*  K 5.9* 5.3* 5.1 4.1 4.2  CL 104 102 104 102 104  CO2 14* 14* 17* 18* 19*  GLUCOSE 114* 192* 102* 133* 138*  BUN 61* 66* 68* 64* 49*  CREATININE 5.01* 4.99* 4.37* 3.52* 3.06*  CALCIUM 9.2 8.8* 8.6* 8.2* 8.2*  MG  --   --  1.6* 1.8 1.6*  PHOS  --  5.2*  --   --   --    Liver Function Tests: Recent Labs  Lab 07/05/22 2325 07/06/22 1308 07/07/22 0502  AST 41  --  36  ALT 39  --  30  ALKPHOS 100  --  72  BILITOT 0.6  --  0.5  PROT 9.2*  --  7.2  ALBUMIN 3.7 3.2* 2.7*   Recent  Labs  Lab 07/05/22 2325  LIPASE 31   No results for input(s): "AMMONIA" in the last 168 hours. CBC: Recent Labs  Lab 07/05/22 2325 07/07/22 0502 07/08/22 0501 07/09/22 0531  WBC 20.4* 18.6* 11.0* 7.8  HGB 13.2 11.8* 10.9* 10.8*  HCT 41.5 36.0* 34.2* 34.0*  MCV 85.9 83.5 83.8 85.0  PLT 431* 414* 417* 443*   Cardiac Enzymes: No results for input(s): "CKTOTAL", "CKMB", "CKMBINDEX", "TROPONINI" in the last 168 hours. BNP: Invalid input(s): "POCBNP" CBG: No results for input(s): "GLUCAP" in the last 168 hours. D-Dimer No results for input(s): "DDIMER" in the last 72 hours. Hgb A1c No results for input(s): "HGBA1C" in the last 72 hours. Lipid Profile No results for input(s): "CHOL", "HDL", "LDLCALC", "TRIG", "CHOLHDL", "LDLDIRECT" in the last 72 hours. Thyroid function studies No results for input(s): "TSH", "T4TOTAL", "T3FREE", "THYROIDAB" in the last 72 hours.  Invalid input(s): "FREET3" Anemia work up No results for input(s): "VITAMINB12", "FOLATE", "FERRITIN", "TIBC", "IRON", "RETICCTPCT" in the last 72 hours. Urinalysis    Component Value Date/Time   COLORURINE YELLOW 07/06/2022 0155   APPEARANCEUR CLOUDY (A) 07/06/2022 0155   LABSPEC 1.020 07/06/2022 0155   PHURINE 6.5 07/06/2022 0155   GLUCOSEU NEGATIVE 07/06/2022 0155   HGBUR  SMALL (A) 07/06/2022 0155   BILIRUBINUR NEGATIVE 07/06/2022 0155   KETONESUR NEGATIVE 07/06/2022 0155   PROTEINUR 30 (A) 07/06/2022 0155   UROBILINOGEN 0.2 06/17/2014 1046   NITRITE POSITIVE (A) 07/06/2022 0155   LEUKOCYTESUR MODERATE (A) 07/06/2022 0155   Sepsis Labs Recent Labs  Lab 07/05/22 2325 07/07/22 0502 07/08/22 0501 07/09/22 0531  WBC 20.4* 18.6* 11.0* 7.8   Microbiology Recent Results (from the past 240 hour(s))  Urine Culture     Status: Abnormal   Collection Time: 07/06/22  1:55 AM   Specimen: Urine, Catheterized  Result Value Ref Range Status   Specimen Description   Final    URINE, CATHETERIZED Performed at [facility], [address], [place], [place] [address]    Special Requests   Final    NONE Performed at [facility], [address], [place], [place] [address]    Culture (A)  Final    60,000 COLONIES/mL METHICILLIN RESISTANT STAPHYLOCOCCUS AUREUS   Report Status 07/08/2022 FINAL  Final   Organism ID, Bacteria METHICILLIN RESISTANT STAPHYLOCOCCUS AUREUS (A)  Final      Susceptibility   Methicillin resistant staphylococcus aureus - MIC*    CIPROFLOXACIN <=0.5 SENSITIVE Sensitive     GENTAMICIN <=0.5 SENSITIVE Sensitive     NITROFURANTOIN <=16 SENSITIVE Sensitive     OXACILLIN >=4 RESISTANT Resistant     TETRACYCLINE <=1 SENSITIVE Sensitive     VANCOMYCIN 1 SENSITIVE Sensitive     TRIMETH/SULFA <=10 SENSITIVE Sensitive     CLINDAMYCIN <=0.25 SENSITIVE Sensitive     RIFAMPIN <=0.5 SENSITIVE Sensitive     Inducible Clindamycin NEGATIVE Sensitive     * 60,000 COLONIES/mL METHICILLIN RESISTANT STAPHYLOCOCCUS AUREUS  Culture, blood (Routine X 2) w Reflex to ID Panel     Status: None (Preliminary result)   Collection Time: 07/06/22  8:26 AM   Specimen: Left Antecubital; Blood  Result Value Ref Range Status   Specimen Description LEFT ANTECUBITAL  Final   Special Requests   Final    BOTTLES DRAWN AEROBIC AND ANAEROBIC Blood Culture adequate volume    Culture   Final    NO GROWTH 3 DAYS Performed at [facility], [address] [place], [place] [address]    Report Status  PENDING  Incomplete  Culture, blood (Routine X 2) w Reflex to ID Panel     Status: None (Preliminary result)   Collection Time: 07/06/22  8:26 AM   Specimen: BLOOD RIGHT HAND  Result Value Ref Range Status   Specimen Description BLOOD RIGHT HAND  Final   Special Requests   Final    BOTTLES DRAWN AEROBIC ONLY Blood Culture results may not be optimal due to an inadequate volume of blood received in culture bottles   Culture   Final    NO GROWTH 3 DAYS Performed at [facility], [address] [place], [place] [address]    Report Status PENDING  Incomplete     Time coordinating discharge: 35 minutes  SIGNED:   Rodena Goldmann, DO [facility] 07/09/2022, 8:52 AM  If 7PM-7AM, please contact night-coverage [email or web address]

## 2022-07-09 NOTE — Progress Notes (Signed)
Pt has a DC order. AVS was given and explained to pt, all questions were answered. Pt will be DC with foley, switched to legbag. Extra supplies were given to pt, pericare and foley care were instructed. Instructed to follow-up Uro and Nepro. Awaiting for father to drive pt home.

## 2022-07-11 LAB — CULTURE, BLOOD (ROUTINE X 2)
Culture: NO GROWTH
Culture: NO GROWTH
Special Requests: ADEQUATE

## 2022-08-10 ENCOUNTER — Encounter: Payer: Self-pay | Admitting: *Deleted

## 2023-01-19 ENCOUNTER — Encounter: Payer: Self-pay | Admitting: *Deleted

## 2023-02-04 IMAGING — DX DG LUMBAR SPINE COMPLETE 4+V
5 series · 5 of 5 positions shown · non-contrast
Comparison: October 19, 2020.

CLINICAL DATA: Chronic lower back and left leg pain without recent
injury.

EXAM:
LUMBAR SPINE - COMPLETE 4+ VIEW

[l-spine ap]
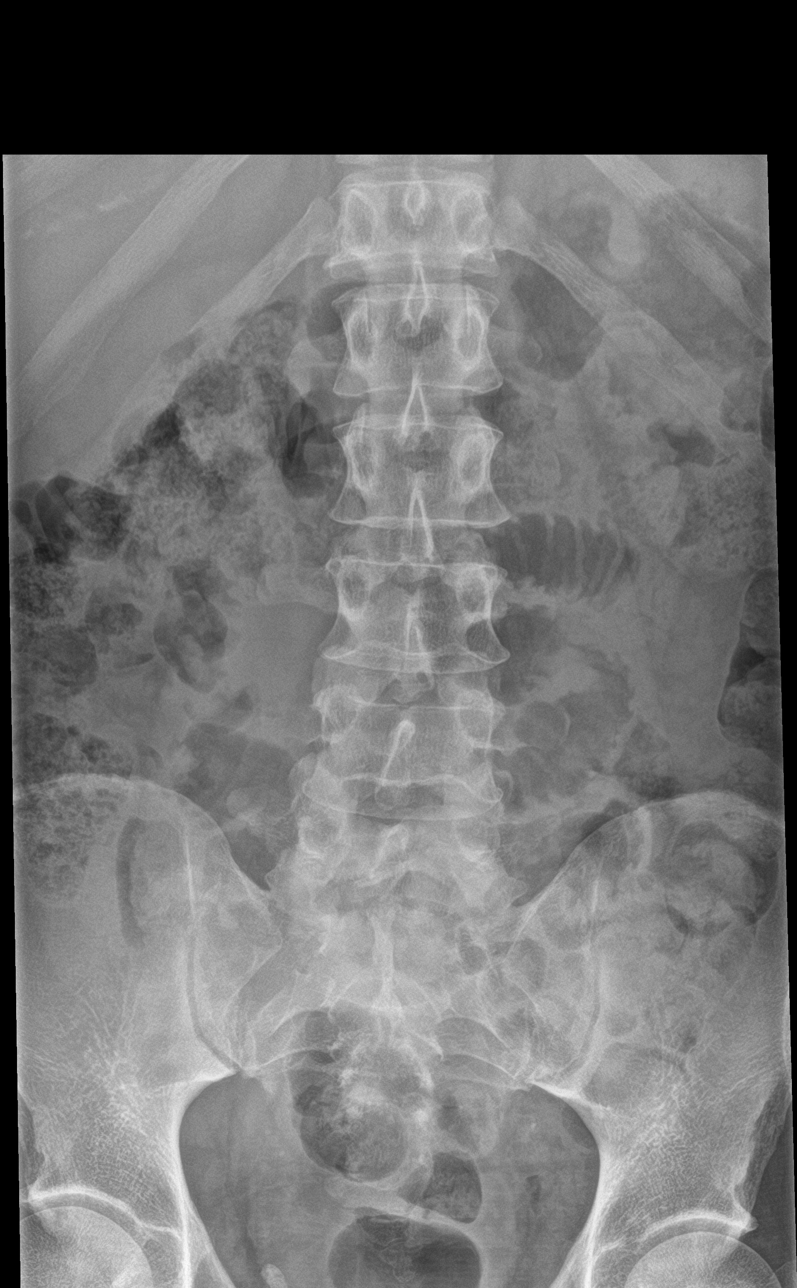

[l-spine obl (1 of 2)]
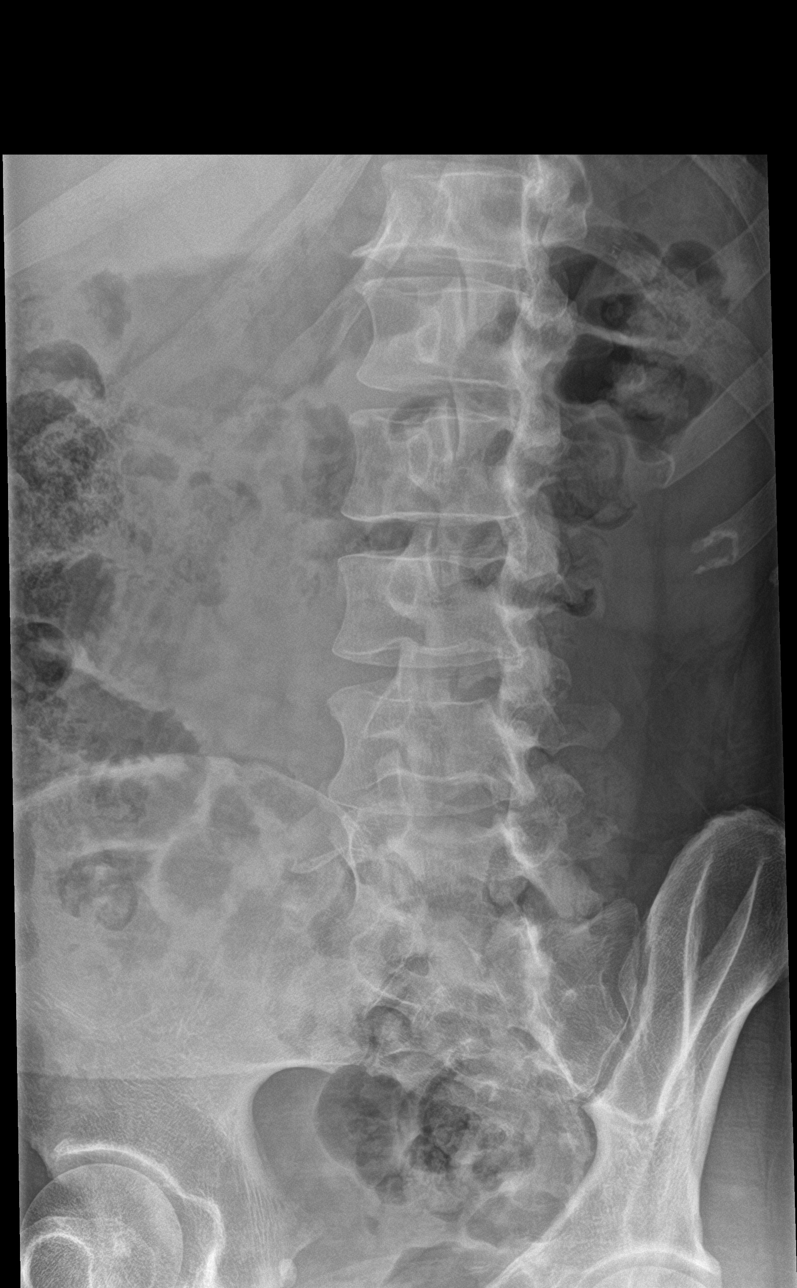

[l-spine obl (2 of 2)]
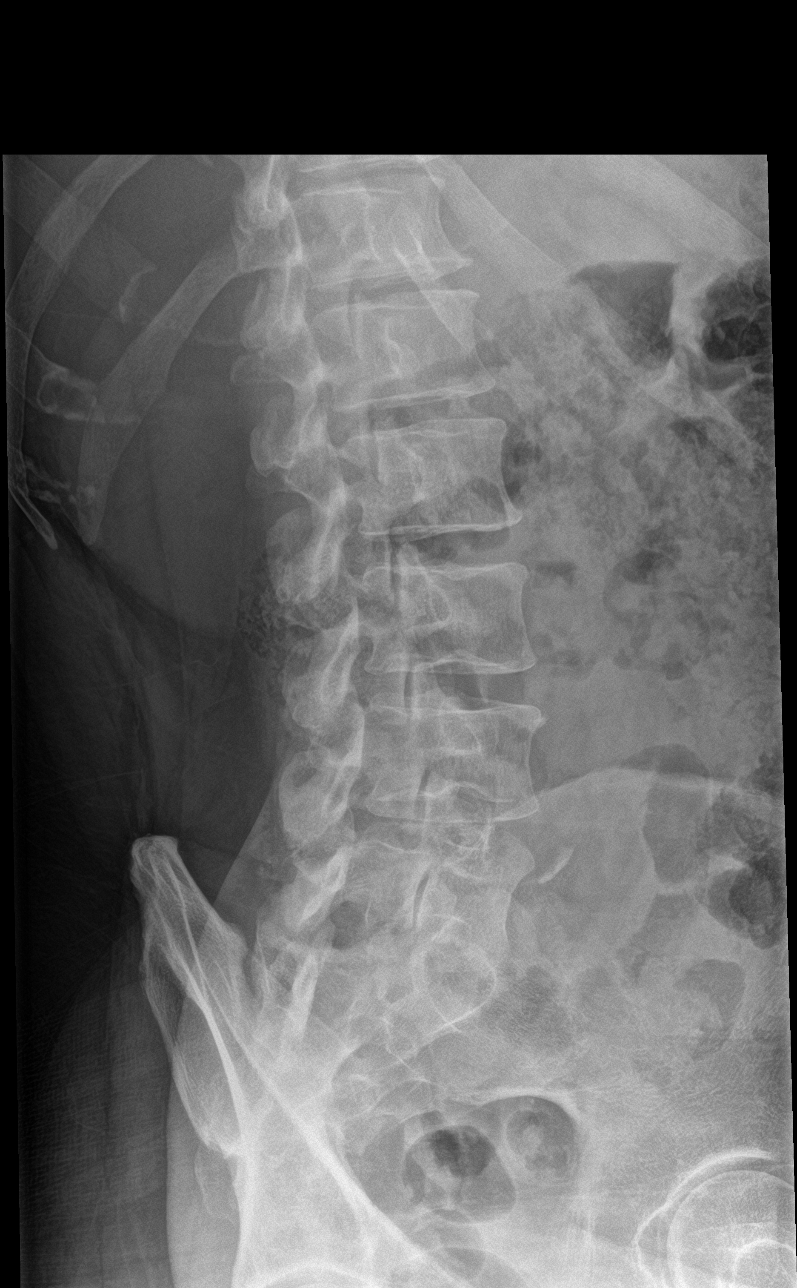

[l-spine lat]
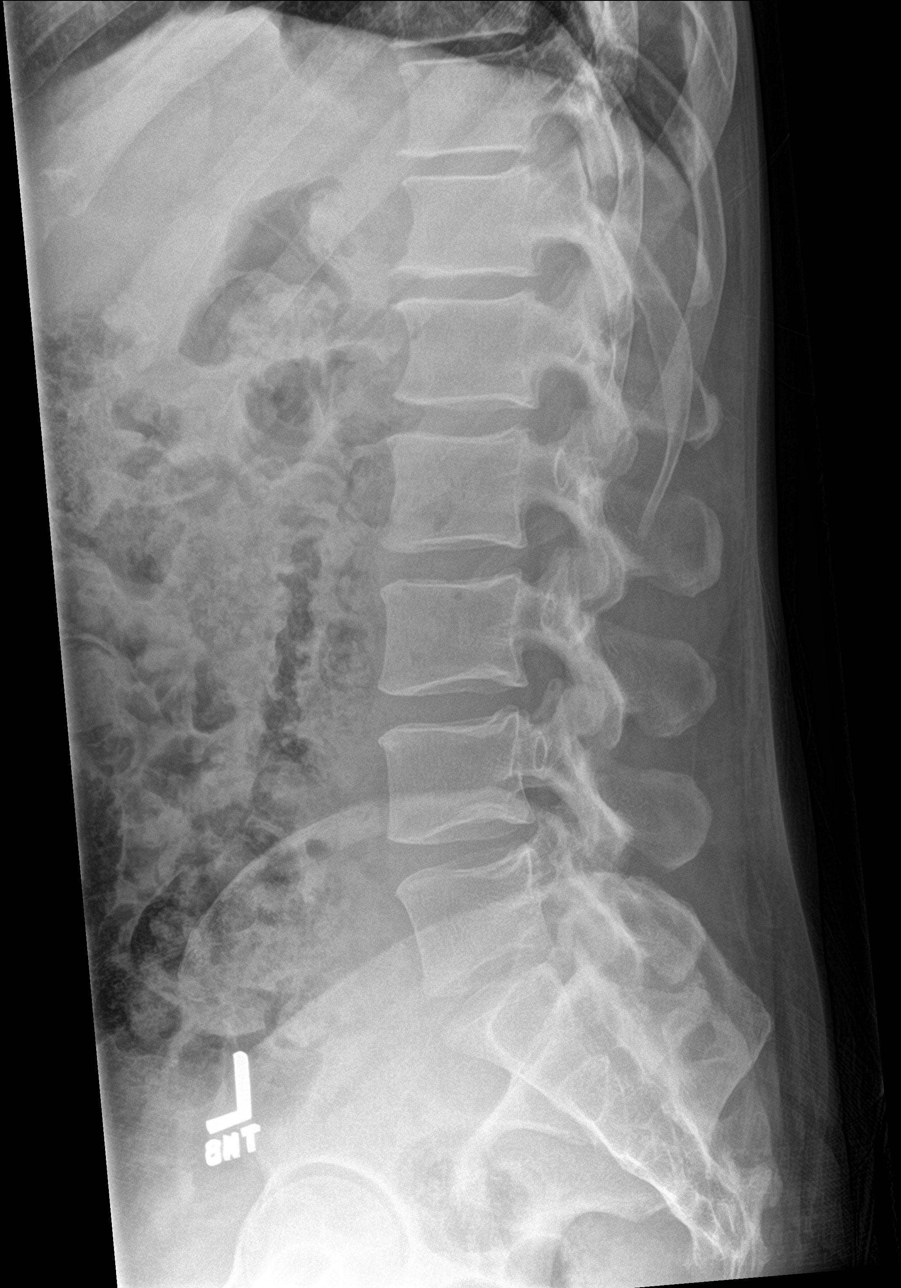

[l-spine spot]
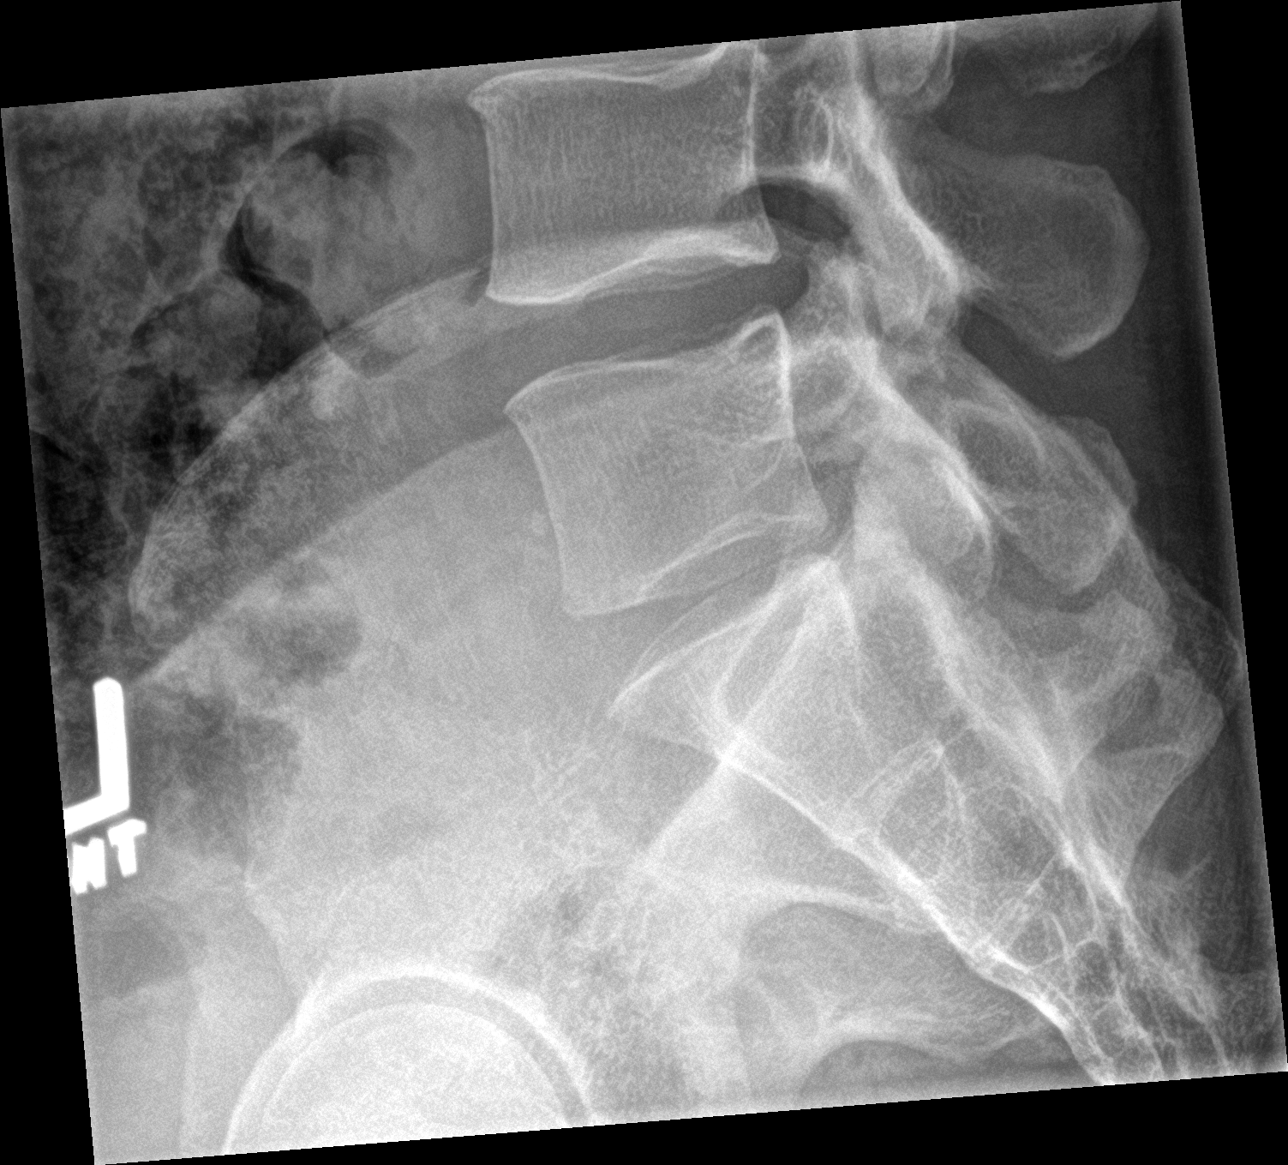

[5 of 5 positions shown; findings below may reference images not displayed]

FINDINGS: There is no evidence of lumbar spine fracture. Alignment is normal.
Mild degenerative joint disease is noted at T12-L1. Remaining disc
spaces are unremarkable.
IMPRESSION: Mild degenerative disc disease at T12-L1. No acute abnormality seen.

## 2023-09-23 ENCOUNTER — Inpatient Hospital Stay (HOSPITAL_COMMUNITY)
Admission: EM | Admit: 2023-09-23 | Discharge: 2023-09-26 | DRG: 683 | Discharge status: Against Medical Advice | Attending: Family Medicine | Admitting: Family Medicine

## 2023-09-23 DIAGNOSIS — E872 Acidosis, unspecified: Secondary | ICD-10-CM | POA: Diagnosis present

## 2023-09-23 DIAGNOSIS — N390 Urinary tract infection, site not specified: Secondary | ICD-10-CM | POA: Diagnosis present

## 2023-09-23 DIAGNOSIS — J309 Allergic rhinitis, unspecified: Secondary | ICD-10-CM | POA: Diagnosis present

## 2023-09-23 DIAGNOSIS — N179 Acute kidney failure, unspecified: Principal | ICD-10-CM

## 2023-09-23 DIAGNOSIS — I10 Essential (primary) hypertension: Secondary | ICD-10-CM | POA: Diagnosis not present

## 2023-09-23 DIAGNOSIS — Z91040 Latex allergy status: Secondary | ICD-10-CM

## 2023-09-23 DIAGNOSIS — N184 Chronic kidney disease, stage 4 (severe): Secondary | ICD-10-CM | POA: Diagnosis not present

## 2023-09-23 DIAGNOSIS — Z91013 Allergy to seafood: Secondary | ICD-10-CM

## 2023-09-23 DIAGNOSIS — N139 Obstructive and reflux uropathy, unspecified: Secondary | ICD-10-CM

## 2023-09-23 DIAGNOSIS — R339 Retention of urine, unspecified: Secondary | ICD-10-CM | POA: Diagnosis present

## 2023-09-23 DIAGNOSIS — Z5329 Procedure and treatment not carried out because of patient's decision for other reasons: Secondary | ICD-10-CM | POA: Diagnosis present

## 2023-09-23 DIAGNOSIS — B1920 Unspecified viral hepatitis C without hepatic coma: Secondary | ICD-10-CM

## 2023-09-23 DIAGNOSIS — E875 Hyperkalemia: Secondary | ICD-10-CM | POA: Diagnosis present

## 2023-09-23 DIAGNOSIS — N1330 Unspecified hydronephrosis: Secondary | ICD-10-CM

## 2023-09-23 DIAGNOSIS — Z91148 Patient's other noncompliance with medication regimen for other reason: Secondary | ICD-10-CM

## 2023-09-23 DIAGNOSIS — I129 Hypertensive chronic kidney disease with stage 1 through stage 4 chronic kidney disease, or unspecified chronic kidney disease: Secondary | ICD-10-CM | POA: Diagnosis present

## 2023-09-23 DIAGNOSIS — F17210 Nicotine dependence, cigarettes, uncomplicated: Secondary | ICD-10-CM | POA: Diagnosis present

## 2023-09-23 LAB — CBC
HCT: 31.9 % — ABNORMAL LOW (ref 39.0–52.0)
Hemoglobin: 9.6 g/dL — ABNORMAL LOW (ref 13.0–17.0)
MCH: 27 pg (ref 26.0–34.0)
MCHC: 30.1 g/dL (ref 30.0–36.0)
MCV: 89.6 fL (ref 80.0–100.0)
Platelets: 531 10*3/uL — ABNORMAL HIGH (ref 150–400)
RBC: 3.56 MIL/uL — ABNORMAL LOW (ref 4.22–5.81)
RDW: 15.1 % (ref 11.5–15.5)
WBC: 12.8 10*3/uL — ABNORMAL HIGH (ref 4.0–10.5)
nRBC: 0 % (ref 0.0–0.2)

## 2023-09-23 LAB — URINALYSIS, ROUTINE W REFLEX MICROSCOPIC
Bacteria, UA: NONE SEEN
Bilirubin Urine: NEGATIVE
Glucose, UA: NEGATIVE mg/dL
Ketones, ur: NEGATIVE mg/dL
Nitrite: POSITIVE — AB
Protein, ur: 30 mg/dL — AB
Specific Gravity, Urine: 1.005 (ref 1.005–1.030)
WBC, UA: >50 WBC/hpf (ref 0–5)
pH: 5 (ref 5.0–8.0)

## 2023-09-23 LAB — BASIC METABOLIC PANEL WITH GFR
Anion gap: 9 (ref 5–15)
BUN: 63 mg/dL — ABNORMAL HIGH (ref 6–20)
CO2: 14 mmol/L — ABNORMAL LOW (ref 22–32)
Calcium: 8.3 mg/dL — ABNORMAL LOW (ref 8.9–10.3)
Chloride: 109 mmol/L (ref 98–111)
Creatinine, Ser: 8.74 mg/dL — ABNORMAL HIGH (ref 0.61–1.24)
GFR, Estimated: 7 mL/min — ABNORMAL LOW (ref >60)
Glucose, Bld: 125 mg/dL — ABNORMAL HIGH (ref 70–99)
Potassium: 5.3 mmol/L — ABNORMAL HIGH (ref 3.5–5.1)
Sodium: 132 mmol/L — ABNORMAL LOW (ref 135–145)

## 2023-09-23 MED ORDER — TAMSULOSIN HCL 0.4 MG PO CAPS
0.4000 mg | ORAL_CAPSULE | ORAL | Status: AC
  Administered 2023-09-23: 0.4 mg via ORAL
  Filled 2023-09-23: qty 1

## 2023-09-23 MED ORDER — SODIUM CHLORIDE 0.9 % IV SOLN: INTRAVENOUS | Status: DC

## 2023-09-23 NOTE — ED Provider Notes (Signed)
 [place] EMERGENCY DEPARTMENT AT [facility] Provider Note   CSN: [id number] Arrival date & time: 09/23/23  2006     History {Add pertinent medical, surgical, social history, OB history to HPI:1} Chief Complaint  Patient presents with   Abdominal Pain   Chest Pain    Wesley Sexton is a 53 y.o. male.   Abdominal Pain Associated symptoms: chest pain   Chest Pain Associated symptoms: abdominal pain    This patient is a 53 year old male, he has a history of hypertension on no medications as he states he does not take them.  He also has a history of recurrent urinary retention and states that he has had to have a Foley catheter a couple of times in the past.  He presents today with a complaint of lower abdominal discomfort, states it was present when he woke up this morning, he reports that the pain radiates to his legs and his back, he even had a minute of chest pain, he is chest pain-free at this time.  He reports that he has not been able to urinate very well today, he has to push on his lower abdomen just to get a couple of drips of urine.  He does not take Flomax  even though he was told he needed to continue to take it in the past.  He does not go to the doctor regularly and states he has not been compliant with any medications for "a long time".    Home Medications Prior to Admission medications   Medication Sig Start Date End Date Taking? Authorizing Provider  amLODipine  (NORVASC ) 5 MG tablet Take 1 tablet (5 mg total) by mouth daily. 07/09/22 08/08/22  Mason Sole, Pratik D, DO  hydrALAZINE  (APRESOLINE ) 100 MG tablet Take 1 tablet (100 mg total) by mouth every 8 (eight) hours. 07/09/22 08/08/22  Doreene Gammon D, DO      Allergies    Latex and Shellfish allergy    Review of Systems   Review of Systems  Cardiovascular:  Positive for chest pain.  Gastrointestinal:  Positive for abdominal pain.  All other systems reviewed and are negative.   Physical Exam Updated Vital  Signs BP (!) 128/93 (BP Location: Left Arm)   Pulse 88   Temp 97.8 F (36.6 C) (Temporal)   Resp 18   Ht 1.727 m (5\' 8" )   Wt 72.6 kg   SpO2 97%   BMI 24.34 kg/m  Physical Exam Vitals and nursing note reviewed.  Constitutional:      General: He is not in acute distress.    Appearance: He is well-developed.  HENT:     Head: Normocephalic and atraumatic.     Mouth/Throat:     Pharynx: No oropharyngeal exudate.  Eyes:     General: No scleral icterus.       Right eye: No discharge.        Left eye: No discharge.     Conjunctiva/sclera: Conjunctivae normal.     Pupils: Pupils are equal, round, and reactive to light.  Neck:     Thyroid: No thyromegaly.     Vascular: No JVD.  Cardiovascular:     Rate and Rhythm: Normal rate and regular rhythm.     Heart sounds: Normal heart sounds. No murmur heard.    No friction rub. No gallop.  Pulmonary:     Effort: Pulmonary effort is normal. No respiratory distress.     Breath sounds: Normal breath sounds. No wheezing or rales.  Abdominal:  General: Bowel sounds are normal. There is no distension.     Palpations: Abdomen is soft. There is no mass.     Tenderness: There is abdominal tenderness.     Comments: Mild tenderness and fullness in the mid lower abdomen, no guarding or peritoneal signs  Musculoskeletal:        General: No tenderness. Normal range of motion.     Cervical back: Normal range of motion and neck supple.     Right lower leg: No edema.     Left lower leg: No edema.  Lymphadenopathy:     Cervical: No cervical adenopathy.  Skin:    General: Skin is warm and dry.     Findings: No erythema or rash.  Neurological:     Mental Status: He is alert.     Coordination: Coordination normal.  Psychiatric:        Behavior: Behavior normal.     ED Results / Procedures / Treatments   Labs (all labs ordered are listed, but only abnormal results are displayed) Labs Reviewed  BASIC METABOLIC PANEL WITH GFR - Abnormal;  Notable for the following components:      Result Value   Sodium 132 (*)    Potassium 5.3 (*)    CO2 14 (*)    Glucose, Bld 125 (*)    BUN 63 (*)    Creatinine, Ser 8.74 (*)    Calcium 8.3 (*)    GFR, Estimated 7 (*)    All other components within normal limits  CBC - Abnormal; Notable for the following components:   WBC 12.8 (*)    RBC 3.56 (*)    Hemoglobin 9.6 (*)    HCT 31.9 (*)    Platelets 531 (*)    All other components within normal limits  URINALYSIS, ROUTINE W REFLEX MICROSCOPIC    EKG EKG Interpretation Date/Time:  Saturday September 23 2023 20:13:51 EDT Ventricular Rate:  85 PR Interval:  124 QRS Duration:  95 QT Interval:  382 QTC Calculation: 455 R Axis:   56  Text Interpretation: Sinus rhythm Biatrial enlargement RSR' in V1 or V2, probably normal variant Confirmed by Early Glisson ([id number]) on 09/23/2023 8:16:47 PM  Radiology No results found.  Procedures .Critical Care  Performed by: Early Glisson, MD Authorized by: Early Glisson, MD   Critical care provider statement:    Critical care time (minutes):  30   Critical care time was exclusive of:  Teaching time   Critical care was necessary to treat or prevent imminent or life-threatening deterioration of the following conditions:  Renal failure   Critical care was time spent personally by me on the following activities:  Development of treatment plan with patient or surrogate, discussions with consultants, evaluation of patient's response to treatment, examination of patient, ordering and review of laboratory studies, ordering and review of radiographic studies, ordering and performing treatments and interventions, pulse oximetry, re-evaluation of patient's condition, review of old charts and obtaining history from patient or surrogate   I assumed direction of critical care for this patient from another provider in my specialty: no     Care discussed with: admitting provider   Comments:         {Document  cardiac monitor, telemetry assessment procedure when appropriate:1}  Medications Ordered in ED Medications  0.9 %  sodium chloride  infusion (0 mLs Intravenous Paused 09/23/23 2217)  tamsulosin  (FLOMAX ) capsule 0.4 mg (0.4 mg Oral Given 09/23/23 2103)    ED Course/ Medical Decision Making/  A&P   {   Click here for ABCD2, HEART and other calculatorsREFRESH Note before signing :1}                              Medical Decision Making Amount and/or Complexity of Data Reviewed Labs: ordered.  Risk Prescription drug management. Decision regarding hospitalization.    This patient presents to the ED for concern of possible urinary retention, there is certainly abdominal pain differential diagnosis includes Adolfo Hooker retention, UTI, cystitis, unlikely to be pyelonephritis, vital signs are normal, no tachycardia fever or hypertension or hypotension    Additional history obtained   Additional history obtained from Electronic Medical Record External records from outside source obtained and reviewed including electronic medical record, the patient was admitted to the hospital about a year ago because of renal failure related to a bladder outlet obstruction D/w Dr. Sunnie England - will admit.   Lab Tests:  I Ordered, and personally interpreted labs.  The pertinent results include:  acute renal failure, electrolytes show potassium of 5.3 without EKG changes, BUN of 63, glucose of 125 and a CO2 of 14.  CBC with leukocytosis of 12,800 and a chronic mild anemia at 9.6.   Imaging Studies ordered:  I ordered imaging studies including bedside ultrasound I independently visualized and interpreted imaging which showed grossly distended urinary bladder with associated bilateral severe hydronephrosis I agree with the radiologist interpretation   Medicines ordered and prescription drug management:  I ordered medication including Foley catheter    I have reviewed the patients home medicines and have made  adjustments as needed   Problem List / ED Course:  Patient will need to be admitted with severe renal failure   Social Determinants of Health:  Medication noncompliance Foley catheter placed for urinary obstruction relief IV fluids for renal failure The patient does not need acute treatment of hyperkalemia at this time      {Document critical care time when appropriate:1} {Document review of labs and clinical decision tools ie heart score, Chads2Vasc2 etc:1}  {Document your independent review of radiology images, and any outside records:1} {Document your discussion with family members, caretakers, and with consultants:1} {Document social determinants of health affecting pt's care:1} {Document your decision making why or why not admission, treatments were needed:1} Final Clinical Impression(s) / ED Diagnoses Final diagnoses:  Acute renal failure, unspecified acute renal failure type (HCC)  Obstructive uropathy    Rx / DC Orders ED Discharge Orders     None

## 2023-09-23 NOTE — ED Notes (Signed)
 Foley cathter was attempted, no urine return. EDP notified.

## 2023-09-23 NOTE — H&P (Incomplete)
  History and Physical    Patient: Wesley Sexton ZOX:[id number] DOB: [birth year] DOA: 09/23/2023 DOS: the patient was seen and examined on 09/23/2023 PCP: Twylla Galen, MD  Patient coming from: {Point_of_Origin:[address]}  Chief Complaint:  Chief Complaint  Patient presents with   Abdominal Pain   Chest Pain   HPI: Wesley Sexton is a 53 y.o. male with medical history significant of ***  Review of Systems: {ROS_Text:26778} Past Medical History:  Diagnosis Date   Hypertension    Pancreatitis    Past Surgical History:  Procedure Laterality Date   "pin hole" in heart     LEG SURGERY     Social History:  reports that he has been smoking cigarettes. He has never used smokeless tobacco. He reports current alcohol use. He reports current drug use. Drugs: Cocaine and Marijuana.  Allergies  Allergen Reactions   Latex Hives   Shellfish Allergy     No family history on file.  Prior to Admission medications   Medication Sig Start Date End Date Taking? Authorizing Provider  amLODipine  (NORVASC ) 5 MG tablet Take 1 tablet (5 mg total) by mouth daily. 07/09/22 08/08/22  Mason Sole, Pratik D, DO  hydrALAZINE  (APRESOLINE ) 100 MG tablet Take 1 tablet (100 mg total) by mouth every 8 (eight) hours. 07/09/22 08/08/22  Doreene Gammon D, DO    Physical Exam: Vitals:   09/23/23 2015 09/23/23 2016 09/23/23 2215 09/23/23 2315  BP:  (!) 128/93 125/80 102/69  Pulse:  88  83  Resp:  18 15 16   Temp: 97.8 F (36.6 C)     TempSrc: Temporal     SpO2:  97%  97%  Weight:  72.6 kg    Height:  5\' 8"  (1.727 m)     *** Data Reviewed: {Tip this will not be part of the note when signed- Document your independent interpretation of telemetry tracing, EKG, lab, Radiology test or any other diagnostic tests. Add any new diagnostic test ordered today. (Optional):26781} {Results:26384}  Assessment and Plan: No notes have been filed under this hospital service. Service: Hospitalist     Advance Care Planning:   Code  Status: Prior ***  Consults: ***  Family Communication: ***  Severity of Illness: {Observation/Inpatient:21159}  Author: Albertus Alt, MD 09/23/2023 11:22 PM  For on call review [email or web address].

## 2023-09-23 NOTE — ED Triage Notes (Signed)
 Pt comes by EMS for body aches, Cp and abd pain. CP is midsternum area and abd pain is lower quads. Pt states all this started when he woke up. Pt states pain is 7/10. Last BM was 09/21/23. A&Ox4.

## 2023-09-23 NOTE — ED Notes (Signed)
 Pt ambulated to restroom without nurses knowledge. Unable to get urine at this time. Post void bladder has of urine.

## 2023-09-24 ENCOUNTER — Other Ambulatory Visit: Payer: Self-pay

## 2023-09-24 DIAGNOSIS — N390 Urinary tract infection, site not specified: Secondary | ICD-10-CM | POA: Diagnosis present

## 2023-09-24 DIAGNOSIS — Z91040 Latex allergy status: Secondary | ICD-10-CM | POA: Diagnosis not present

## 2023-09-24 DIAGNOSIS — E872 Acidosis, unspecified: Secondary | ICD-10-CM | POA: Diagnosis present

## 2023-09-24 DIAGNOSIS — Z91148 Patient's other noncompliance with medication regimen for other reason: Secondary | ICD-10-CM | POA: Diagnosis not present

## 2023-09-24 DIAGNOSIS — B1920 Unspecified viral hepatitis C without hepatic coma: Secondary | ICD-10-CM | POA: Diagnosis present

## 2023-09-24 DIAGNOSIS — N184 Chronic kidney disease, stage 4 (severe): Secondary | ICD-10-CM | POA: Diagnosis present

## 2023-09-24 DIAGNOSIS — J309 Allergic rhinitis, unspecified: Secondary | ICD-10-CM | POA: Diagnosis present

## 2023-09-24 DIAGNOSIS — I10 Essential (primary) hypertension: Secondary | ICD-10-CM | POA: Diagnosis not present

## 2023-09-24 DIAGNOSIS — E875 Hyperkalemia: Secondary | ICD-10-CM | POA: Diagnosis present

## 2023-09-24 DIAGNOSIS — N139 Obstructive and reflux uropathy, unspecified: Secondary | ICD-10-CM | POA: Diagnosis present

## 2023-09-24 DIAGNOSIS — R339 Retention of urine, unspecified: Secondary | ICD-10-CM | POA: Diagnosis present

## 2023-09-24 DIAGNOSIS — N179 Acute kidney failure, unspecified: Secondary | ICD-10-CM | POA: Diagnosis present

## 2023-09-24 DIAGNOSIS — Z5329 Procedure and treatment not carried out because of patient's decision for other reasons: Secondary | ICD-10-CM | POA: Diagnosis present

## 2023-09-24 DIAGNOSIS — F17210 Nicotine dependence, cigarettes, uncomplicated: Secondary | ICD-10-CM | POA: Diagnosis present

## 2023-09-24 DIAGNOSIS — I129 Hypertensive chronic kidney disease with stage 1 through stage 4 chronic kidney disease, or unspecified chronic kidney disease: Secondary | ICD-10-CM | POA: Diagnosis present

## 2023-09-24 DIAGNOSIS — Z91013 Allergy to seafood: Secondary | ICD-10-CM | POA: Diagnosis not present

## 2023-09-24 LAB — CBC
HCT: 36.1 % — ABNORMAL LOW (ref 39.0–52.0)
Hemoglobin: 10.8 g/dL — ABNORMAL LOW (ref 13.0–17.0)
MCH: 26.9 pg (ref 26.0–34.0)
MCHC: 29.9 g/dL — ABNORMAL LOW (ref 30.0–36.0)
MCV: 89.8 fL (ref 80.0–100.0)
Platelets: 757 10*3/uL — ABNORMAL HIGH (ref 150–400)
RBC: 4.02 MIL/uL — ABNORMAL LOW (ref 4.22–5.81)
RDW: 15.1 % (ref 11.5–15.5)
WBC: 10.3 10*3/uL (ref 4.0–10.5)
nRBC: 0 % (ref 0.0–0.2)

## 2023-09-24 LAB — BASIC METABOLIC PANEL WITH GFR
Anion gap: 12 (ref 5–15)
BUN: 60 mg/dL — ABNORMAL HIGH (ref 6–20)
CO2: 13 mmol/L — ABNORMAL LOW (ref 22–32)
Calcium: 8.8 mg/dL — ABNORMAL LOW (ref 8.9–10.3)
Chloride: 111 mmol/L (ref 98–111)
Creatinine, Ser: 8.54 mg/dL — ABNORMAL HIGH (ref 0.61–1.24)
GFR, Estimated: 7 mL/min — ABNORMAL LOW (ref >60)
Glucose, Bld: 91 mg/dL (ref 70–99)
Potassium: 5.6 mmol/L — ABNORMAL HIGH (ref 3.5–5.1)
Sodium: 136 mmol/L (ref 135–145)

## 2023-09-24 LAB — HIV ANTIBODY (ROUTINE TESTING W REFLEX): HIV Screen 4th Generation wRfx: NONREACTIVE

## 2023-09-24 MED ORDER — TAMSULOSIN HCL 0.4 MG PO CAPS
0.4000 mg | ORAL_CAPSULE | Freq: Every day | ORAL | Status: DC
  Administered 2023-09-24 – 2023-09-25 (×2): 0.4 mg via ORAL
  Filled 2023-09-24 (×2): qty 1

## 2023-09-24 MED ORDER — ACETAMINOPHEN 650 MG RE SUPP: 650.0000 mg | Freq: Four times a day (QID) | RECTAL | Status: DC | PRN

## 2023-09-24 MED ORDER — AMLODIPINE BESYLATE 5 MG PO TABS
5.0000 mg | ORAL_TABLET | Freq: Every day | ORAL | Status: DC
  Administered 2023-09-24 – 2023-09-26 (×3): 5 mg via ORAL
  Filled 2023-09-24 (×3): qty 1

## 2023-09-24 MED ORDER — SODIUM ZIRCONIUM CYCLOSILICATE 10 G PO PACK
10.0000 g | PACK | Freq: Three times a day (TID) | ORAL | Status: AC
  Administered 2023-09-24 (×3): 10 g via ORAL
  Filled 2023-09-24: qty 2
  Filled 2023-09-24 (×2): qty 1

## 2023-09-24 MED ORDER — SODIUM CHLORIDE 0.9 % IV SOLN
1.0000 g | INTRAVENOUS | Status: AC
  Administered 2023-09-24 – 2023-09-26 (×3): 1 g via INTRAVENOUS
  Filled 2023-09-24 (×3): qty 10

## 2023-09-24 MED ORDER — ORAL CARE MOUTH RINSE: 15.0000 mL | OROMUCOSAL | Status: DC | PRN

## 2023-09-24 MED ORDER — ONDANSETRON HCL 4 MG/2ML IJ SOLN: 4.0000 mg | Freq: Four times a day (QID) | INTRAMUSCULAR | Status: DC | PRN

## 2023-09-24 MED ORDER — ONDANSETRON HCL 4 MG PO TABS
4.0000 mg | ORAL_TABLET | Freq: Four times a day (QID) | ORAL | Status: DC | PRN
Start: 2023-09-24 — End: 2023-09-26

## 2023-09-24 MED ORDER — SODIUM CHLORIDE 0.9 % IV SOLN: INTRAVENOUS | Status: AC

## 2023-09-24 MED ORDER — ENOXAPARIN SODIUM 30 MG/0.3ML IJ SOSY
30.0000 mg | PREFILLED_SYRINGE | INTRAMUSCULAR | Status: DC
  Administered 2023-09-24 – 2023-09-26 (×3): 30 mg via SUBCUTANEOUS
  Filled 2023-09-24 (×3): qty 0.3

## 2023-09-24 MED ORDER — ACETAMINOPHEN 325 MG PO TABS
650.0000 mg | ORAL_TABLET | Freq: Four times a day (QID) | ORAL | Status: DC | PRN
Start: 2023-09-24 — End: 2023-09-26
  Administered 2023-09-24: 650 mg via ORAL
  Filled 2023-09-24: qty 2

## 2023-09-24 MED ORDER — CHLORHEXIDINE GLUCONATE CLOTH 2 % EX PADS
6.0000 | MEDICATED_PAD | Freq: Every day | CUTANEOUS | Status: DC
  Administered 2023-09-24 – 2023-09-26 (×3): 6 via TOPICAL

## 2023-09-24 NOTE — Assessment & Plan Note (Signed)
 Hyponatremia, hyperkalemia, non anion gap metabolic acidosis,  His discharge serum cr last year was 3.   Plan to continue to monitor urine output with foley catheter.  IV fluids with isotonic saline at 100 ml per hr Follow up renal function and electrolytes, avoid hypotension and nephrotoxic medications

## 2023-09-24 NOTE — Hospital Course (Signed)
 53 y.o. male with medical history significant of hypertension and  history of urinary retention who presented with difficulty urinating.  Patient developed acute sings of urinary retention with difficulty voiding, positive urinary dripping, frequent urination and pelvic pain.  Pain was worsening and radiating to his chest. It was severe in intensity and dull in nature. No improving or worsening factors.   Because persistent symptoms he called EMS. He was found in distress due to pain and was transported to the ED.    In the ED he was found to have urinary retention per point of care ultrasound, foley catheter was inserted with improvement in his symptoms.     He was hospitalized from 07/05/23 to 07/09/22 for urinary retention with urinary tract infection. He was discharged with foley catheter and flomax . Follow up with Urology as outpatient.  Per his report foley was removed one month later, and eventually he stopped taking flomax .

## 2023-09-24 NOTE — Progress Notes (Signed)
 PROGRESS NOTE   Wesley Sexton  ONG:[id number] DOB: 23-Dec-1970 DOA: 09/23/2023 PCP: Twylla Galen, MD   Chief Complaint  Patient presents with   Abdominal Pain   Chest Pain   Level of care: Med-Surg  Brief Admission History:  53 y.o. male with medical history significant of hypertension and  history of urinary retention who presented with difficulty urinating.  Patient developed acute sings of urinary retention with difficulty voiding, positive urinary dripping, frequent urination and pelvic pain.  Pain was worsening and radiating to his chest. It was severe in intensity and dull in nature. No improving or worsening factors.   Because persistent symptoms he called EMS. He was found in distress due to pain and was transported to the ED.    In the ED he was found to have urinary retention per point of care ultrasound, foley catheter was inserted with improvement in his symptoms.     He was hospitalized from 07/05/23 to 07/09/22 for urinary retention with urinary tract infection. He was discharged with foley catheter and flomax . Follow up with Urology as outpatient.  Per his report foley was removed one month later, and eventually he stopped taking flomax .     Assessment and Plan:  Acute recurrent Urinary retention He was hospitalized for same reason last year in March,  Apparently he was lost to outpatient follow up.  Foley catheter has been replaced and now urine flowing freely Monitor urine output and will get a formal renal ultrasound Restarted tamsulosin  0.4 mg daily  Will need outpatient follow up with Urology.   UTI with pyuria Urine analysis with pyuria and positive nitrite, continue IV ceftriaxone  for urinary tract infection (present on admission with no sepsis)   CKD (chronic kidney disease) stage 4, GFR 15-29 ml/min  Hyponatremia, hyperkalemia, non anion gap metabolic acidosis,  His discharge serum cr last year was 3.  Plan to continue to monitor urine output with foley  catheter.  IV fluids with isotonic saline at 100 ml per hr Follow up renal function and electrolytes, avoid hypotension and nephrotoxic medications   Essential hypertension Pt reported no longer taking amlodipine  and hydralazine .  Admission blood pressure is controlled.  Monitoring BP    DVT prophylaxis: enoxaparin Code Status: Full  Family Communication:  Disposition: home in 1-2 days    Consultants:   Procedures:   Antimicrobials:  Ceftriaxone  6/8>>   Subjective: No specific complaints  Objective: Vitals:   09/24/23 0945 09/24/23 1015 09/24/23 1423 09/24/23 1426  BP: (!) 129/95 (!) 128/97  130/88  Pulse: 72 83 70 71  Resp:   17 17  Temp:    98.1 F (36.7 C)  TempSrc:    Oral  SpO2: 99% 99% 100% 99%  Weight:      Height:        Intake/Output Summary (Last 24 hours) at 09/24/2023 1541 Last data filed at 09/24/2023 0641 Gross per 24 hour  Intake --  Output 1420 ml  Net -1420 ml   Filed Weights   09/23/23 2016  Weight: 72.6 kg   Examination:  General exam: Appears calm and comfortable  Respiratory system: Clear to auscultation. Respiratory effort normal. Cardiovascular system: normal S1 & S2 heard. No JVD, murmurs, rubs, gallops or clicks. No pedal edema. Gastrointestinal system: Abdomen is nondistended, soft and nontender. No organomegaly or masses felt. Normal bowel sounds heard. GU: foley in place with amber urine seen.  Central nervous system: Alert and oriented. No focal neurological deficits. Extremities: Symmetric 5 x 5 power.  Skin: No rashes, lesions or ulcers. Psychiatry: Judgement and insight appear normal. Mood & affect appropriate.   Data Reviewed: I have personally reviewed following labs and imaging studies  CBC: Recent Labs  Lab 09/23/23 2038 09/24/23 0700  WBC 12.8* 10.3  HGB 9.6* 10.8*  HCT 31.9* 36.1*  MCV 89.6 89.8  PLT 531* 757*    Basic Metabolic Panel: Recent Labs  Lab 09/23/23 2038 09/24/23 0700  NA 132* 136  K 5.3*  5.6*  CL 109 111  CO2 14* 13*  GLUCOSE 125* 91  BUN 63* 60*  CREATININE 8.74* 8.54*  CALCIUM 8.3* 8.8*    CBG: No results for input(s): "GLUCAP" in the last 168 hours.  No results found for this or any previous visit (from the past 240 hours).   Radiology Studies: No results found.  Scheduled Meds:  enoxaparin (LOVENOX) injection  30 mg Subcutaneous Q24H   sodium zirconium cyclosilicate   10 g Oral TID   tamsulosin   0.4 mg Oral QPC supper   Continuous Infusions:  sodium chloride  100 mL/hr at 09/24/23 1004   cefTRIAXone  (ROCEPHIN )  IV Stopped (09/24/23 0740)     LOS: 0 days   Time spent: 57 mins  Kaleeah Gingerich Lincoln Renshaw, MD How to contact the [facility] Attending or Consulting provider 7A - 7P or covering provider during after hours 7P -7A, for this patient?  Check the care team in [facility] and look for a) attending/consulting [facility] provider listed and b) the [facility] team listed Log into [email or web address] to find provider on call.  Locate the [facility] provider you are looking for under [facility] and page to a number that you can be directly reached. If you still have difficulty reaching the provider, please page the [facility] (Director on Call) for the Hospitalists listed on amion for assistance.  09/24/2023, 3:41 PM

## 2023-09-24 NOTE — Assessment & Plan Note (Signed)
 At home patient on amlodipine  and hydralazine .  Admission blood pressure is controlled.  Plan to hold on antihypertensive medications to avoid hypotension

## 2023-09-24 NOTE — Assessment & Plan Note (Addendum)
 He was hospitalized for same reason last year in March,  Apparently he lost to follow up.  Foley catheter has been replaced Monitor urine output and will get a formal renal ultrasound Resume flomax . Will need outpatient follow up with Urology.   Urine analysis with pyuria, will add IV ceftriaxone  for urinary tract infection (present on admission with no sepsis)

## 2023-09-25 ENCOUNTER — Inpatient Hospital Stay (HOSPITAL_COMMUNITY)

## 2023-09-25 DIAGNOSIS — I10 Essential (primary) hypertension: Secondary | ICD-10-CM | POA: Diagnosis not present

## 2023-09-25 DIAGNOSIS — N184 Chronic kidney disease, stage 4 (severe): Secondary | ICD-10-CM | POA: Diagnosis not present

## 2023-09-25 DIAGNOSIS — R339 Retention of urine, unspecified: Secondary | ICD-10-CM | POA: Diagnosis not present

## 2023-09-25 LAB — RENAL FUNCTION PANEL
Albumin: 2.3 g/dL — ABNORMAL LOW (ref 3.5–5.0)
Anion gap: 11 (ref 5–15)
BUN: 49 mg/dL — ABNORMAL HIGH (ref 6–20)
CO2: 13 mmol/L — ABNORMAL LOW (ref 22–32)
Calcium: 8 mg/dL — ABNORMAL LOW (ref 8.9–10.3)
Chloride: 110 mmol/L (ref 98–111)
Creatinine, Ser: 7.19 mg/dL — ABNORMAL HIGH (ref 0.61–1.24)
GFR, Estimated: 8 mL/min — ABNORMAL LOW (ref >60)
Glucose, Bld: 105 mg/dL — ABNORMAL HIGH (ref 70–99)
Phosphorus: 5.6 mg/dL — ABNORMAL HIGH (ref 2.5–4.6)
Potassium: 4.8 mmol/L (ref 3.5–5.1)
Sodium: 134 mmol/L — ABNORMAL LOW (ref 135–145)

## 2023-09-25 LAB — MRSA NEXT GEN BY PCR, NASAL: MRSA by PCR Next Gen: NOT DETECTED

## 2023-09-25 MED ORDER — SODIUM ZIRCONIUM CYCLOSILICATE 10 G PO PACK
10.0000 g | PACK | Freq: Three times a day (TID) | ORAL | Status: AC
  Administered 2023-09-25 (×2): 10 g via ORAL
  Filled 2023-09-25 (×2): qty 1

## 2023-09-25 MED ORDER — SODIUM CHLORIDE 0.9 % IV SOLN: INTRAVENOUS | Status: DC

## 2023-09-25 MED ORDER — FLUTICASONE PROPIONATE 50 MCG/ACT NA SUSP
2.0000 | Freq: Every day | NASAL | Status: DC
  Administered 2023-09-25 – 2023-09-26 (×2): 2 via NASAL
  Filled 2023-09-25: qty 16

## 2023-09-25 MED ORDER — LORATADINE 10 MG PO TABS
10.0000 mg | ORAL_TABLET | ORAL | Status: DC
  Administered 2023-09-25: 10 mg via ORAL
  Filled 2023-09-25: qty 1

## 2023-09-25 NOTE — Progress Notes (Signed)
 PROGRESS NOTE   Wesley Sexton  JXB:[id number] DOB: 10-16-1970 DOA: 09/23/2023 PCP: Twylla Galen, MD   Chief Complaint  Patient presents with   Abdominal Pain   Chest Pain   Level of care: Med-Surg  Brief Admission History:  53 y.o. male with medical history significant of hypertension and  history of urinary retention who presented with difficulty urinating.  Patient developed acute sings of urinary retention with difficulty voiding, positive urinary dripping, frequent urination and pelvic pain.  Pain was worsening and radiating to his chest. It was severe in intensity and dull in nature. No improving or worsening factors.   Because persistent symptoms he called EMS. He was found in distress due to pain and was transported to the ED.    In the ED he was found to have urinary retention per point of care ultrasound, foley catheter was inserted with improvement in his symptoms.     He was hospitalized from 07/05/23 to 07/09/22 for urinary retention with urinary tract infection. He was discharged with foley catheter and flomax . Follow up with Urology as outpatient.  Per his report foley was removed one month later, and eventually he stopped taking flomax .     Assessment and Plan:  Acute recurrent Urinary retention He was hospitalized for same reason last year in March,  Apparently he was lost to outpatient follow up.  Foley catheter has been replaced and now urine flowing freely Monitor urine output and will get a formal renal ultrasound Restarted tamsulosin  0.4 mg daily  Will need outpatient follow up with Urology.   UTI with pyuria Urinalysis with pyuria and positive nitrite, continue IV ceftriaxone  x 3 doses for urinary tract infection (present on admission with no sepsis)   CKD (chronic kidney disease) stage 4, GFR 15-29 ml/min  Hyponatremia, hyperkalemia, non anion gap metabolic acidosis,  His discharge serum cr last year was 3.  Plan to continue to monitor urine output with  foley catheter.  Reducing IV fluids with isotonic saline 60 ml per hr Follow up renal function and electrolytes, avoid hypotension and nephrotoxic medications Spoke with nephrologist Dr. Jearldine Mina, he recommended continue foley cath and outpatient nephrology follow up   Essential hypertension Pt reported no longer taking amlodipine  and hydralazine .  He has had some elevated BPs and started back on amlodipine  5 mg  Monitoring BP   DVT prophylaxis: enoxaparin Code Status: Full  Family Communication:  Disposition: home tomorrow if remains stable    Consultants:   Procedures:   Antimicrobials:  Ceftriaxone  6/8>>   Subjective: Pt says that he never followed up with a urologist after his last bout with urinary retention.    Objective: Vitals:   09/24/23 1603 09/24/23 1951 09/25/23 0420 09/25/23 0913  BP: (!) 149/106 (!) 149/100 (!) 125/93 136/86  Pulse: 71 84 76 80  Resp: 20 20 18    Temp: (!) 97.5 F (36.4 C) 99 F (37.2 C) 98.6 F (37 C)   TempSrc: Oral Oral Oral   SpO2: 100% 97% 100% 100%  Weight: 60.7 kg     Height: 5\' 8"  (1.727 m)       Intake/Output Summary (Last 24 hours) at 09/25/2023 0926 Last data filed at 09/25/2023 1308 Gross per 24 hour  Intake 2993.62 ml  Output 1500 ml  Net 1493.62 ml   Filed Weights   09/23/23 2016 09/24/23 1603  Weight: 72.6 kg 60.7 kg   Examination:  General exam: Appears calm and comfortable  Respiratory system: Clear to auscultation. Respiratory effort normal. Cardiovascular  system: normal S1 & S2 heard. No JVD, murmurs, rubs, gallops or clicks. No pedal edema. Gastrointestinal system: Abdomen is nondistended, soft and nontender. No organomegaly or masses felt. Normal bowel sounds heard. GU: foley in place with light amber urine seen.  Central nervous system: Alert and oriented. No focal neurological deficits. Extremities: Symmetric 5 x 5 power. Skin: No rashes, lesions or ulcers. Psychiatry: Judgement and insight appear normal.  Mood & affect appropriate.   Data Reviewed: I have personally reviewed following labs and imaging studies  CBC: Recent Labs  Lab 09/23/23 2038 09/24/23 0700  WBC 12.8* 10.3  HGB 9.6* 10.8*  HCT 31.9* 36.1*  MCV 89.6 89.8  PLT 531* 757*    Basic Metabolic Panel: Recent Labs  Lab 09/23/23 2038 09/24/23 0700 09/25/23 0441  NA 132* 136 134*  K 5.3* 5.6* 4.8  CL 109 111 110  CO2 14* 13* 13*  GLUCOSE 125* 91 105*  BUN 63* 60* 49*  CREATININE 8.74* 8.54* 7.19*  CALCIUM 8.3* 8.8* 8.0*  PHOS  --   --  5.6*    CBG: No results for input(s): "GLUCAP" in the last 168 hours.  No results found for this or any previous visit (from the past 240 hours).   Radiology Studies: No results found.  Scheduled Meds:  amLODipine   5 mg Oral Daily   Chlorhexidine  Gluconate Cloth  6 each Topical Daily   enoxaparin (LOVENOX) injection  30 mg Subcutaneous Q24H   tamsulosin   0.4 mg Oral QPC supper   Continuous Infusions:  sodium chloride  60 mL/hr at 09/25/23 0919   cefTRIAXone  (ROCEPHIN )  IV 1 g (09/25/23 0546)     LOS: 1 day   Time spent: 55 mins  [name], MD How to contact the [facility] Attending or Consulting provider 7A - 7P or covering provider during after hours 7P -7A, for this patient?  Check the care team in [facility] and look for a) attending/consulting TRH provider listed and b) the TRH team listed Log into [email or web address] to find provider on call.  Locate the TRH provider you are looking for under [facility] and page to a number that you can be directly reached. If you still have difficulty reaching the provider, please page the [facility] (Director on Call) for the Hospitalists listed on amion for assistance.  09/25/2023, 9:26 AM

## 2023-09-25 NOTE — Plan of Care (Signed)

## 2023-09-25 NOTE — Plan of Care (Signed)

## 2023-09-26 DIAGNOSIS — N184 Chronic kidney disease, stage 4 (severe): Secondary | ICD-10-CM | POA: Diagnosis not present

## 2023-09-26 DIAGNOSIS — R339 Retention of urine, unspecified: Secondary | ICD-10-CM | POA: Diagnosis not present

## 2023-09-26 DIAGNOSIS — N179 Acute kidney failure, unspecified: Secondary | ICD-10-CM

## 2023-09-26 DIAGNOSIS — I10 Essential (primary) hypertension: Secondary | ICD-10-CM | POA: Diagnosis not present

## 2023-09-26 LAB — RENAL FUNCTION PANEL
Albumin: 2.4 g/dL — ABNORMAL LOW (ref 3.5–5.0)
Anion gap: 8 (ref 5–15)
BUN: 46 mg/dL — ABNORMAL HIGH (ref 6–20)
CO2: 17 mmol/L — ABNORMAL LOW (ref 22–32)
Calcium: 7.9 mg/dL — ABNORMAL LOW (ref 8.9–10.3)
Chloride: 112 mmol/L — ABNORMAL HIGH (ref 98–111)
Creatinine, Ser: 6.08 mg/dL — ABNORMAL HIGH (ref 0.61–1.24)
GFR, Estimated: 10 mL/min — ABNORMAL LOW (ref >60)
Glucose, Bld: 87 mg/dL (ref 70–99)
Phosphorus: 5.8 mg/dL — ABNORMAL HIGH (ref 2.5–4.6)
Potassium: 5.3 mmol/L — ABNORMAL HIGH (ref 3.5–5.1)
Sodium: 137 mmol/L (ref 135–145)

## 2023-09-26 MED ORDER — SODIUM ZIRCONIUM CYCLOSILICATE 10 G PO PACK
10.0000 g | PACK | Freq: Three times a day (TID) | ORAL | Status: DC
  Administered 2023-09-26: 10 g via ORAL
  Filled 2023-09-26: qty 1

## 2023-09-26 MED ORDER — STERILE WATER FOR INJECTION IV SOLN
INTRAVENOUS | Status: DC
  Filled 2023-09-26 (×2): qty 1000

## 2023-09-26 MED ORDER — TAMSULOSIN HCL 0.4 MG PO CAPS: 0.4000 mg | ORAL_CAPSULE | Freq: Every day | ORAL | 0 refills | Status: DC

## 2023-09-26 NOTE — Progress Notes (Signed)
 Patient stated that he would like to leave against medical advice. Discuss with risk involved with leaving. Patient asked to speak with MD. MD was made aware of patient's request. After speaking with MD patient still wanted to leave and stated he understood risks involved with leaving and possible worsening of condition.  Patient educated on foley care. Patient verbalized understanding and demonstrated care of foley.  Patient IV removed and patient left with family.

## 2023-09-26 NOTE — Discharge Instructions (Signed)
 YOU WERE ADVISED AGAINST LEAVING THE HOSPITAL AGAINST MEDICAL ADVICE BUT YOU HAVE INSISTED ON LEAVING.  YOU ARE AT HIGH RISK FOR ADVERSE OUTCOMES, RE-HOSPITALIZATION AND PROGRESSIVE RENAL FAILURE AND DIALYSIS IF YOU DON'T FOLLOW UP LIKE RECOMMENDED.   PLEASE SEE UROLOGY AND NEPHROLOGY AND TRY TO MAKE AN APPOINTMENT WITH [facility] SO THAT YOU CAN BE EVALUATED AND TREATED FOR HEPATITIS C.       IMPORTANT INFORMATION: PAY CLOSE ATTENTION   PHYSICIAN DISCHARGE INSTRUCTIONS  Follow with Primary care provider  Twylla Galen, MD  and other consultants as instructed by your Hospitalist Physician  SEEK MEDICAL CARE OR RETURN TO EMERGENCY ROOM IF SYMPTOMS COME BACK, WORSEN OR NEW PROBLEM DEVELOPS   Please note: You were cared for by a hospitalist during your hospital stay. Every effort will be made to forward records to your primary care provider.  You can request that your primary care provider send for your hospital records if they have not received them.  Once you are discharged, your primary care physician will handle any further medical issues. Please note that NO REFILLS for any discharge medications will be authorized once you are discharged, as it is imperative that you return to your primary care physician (or establish a relationship with a primary care physician if you do not have one) for your post hospital discharge needs so that they can reassess your need for medications and monitor your lab values.  Please get a complete blood count and chemistry panel checked by your Primary MD at your next visit, and again as instructed by your Primary MD.  Get Medicines reviewed and adjusted: Please take all your medications with you for your next visit with your Primary MD  Laboratory/radiological data: Please request your Primary MD to go over all hospital tests and procedure/radiological results at the follow up, please ask your primary care provider to get all Hospital records sent to his/her  office.  In some cases, they will be blood work, cultures and biopsy results pending at the time of your discharge. Please request that your primary care provider follow up on these results.  If you are diabetic, please bring your blood sugar readings with you to your follow up appointment with primary care.    Please call and make your follow up appointments as soon as possible.    Also Note the following: If you experience worsening of your admission symptoms, develop shortness of breath, life threatening emergency, suicidal or homicidal thoughts you must seek medical attention immediately by calling 911 or calling your MD immediately  if symptoms less severe.  You must read complete instructions/literature along with all the possible adverse reactions/side effects for all the Medicines you take and that have been prescribed to you. Take any new Medicines after you have completely understood and accpet all the possible adverse reactions/side effects.   Do not drive when taking Pain medications or sleeping medications (Benzodiazepines)  Do not take more than prescribed Pain, Sleep and Anxiety Medications. It is not advisable to combine anxiety,sleep and pain medications without talking with your primary care practitioner  Special Instructions: If you have smoked or chewed Tobacco  in the last 2 yrs please stop smoking, stop any regular Alcohol  and or any Recreational drug use.  Wear Seat belts while driving.  Do not drive if taking any narcotic, mind altering or controlled substances or recreational drugs or alcohol.

## 2023-09-26 NOTE — Discharge Summary (Signed)
 Physician Discharge Summary  Wesley Sexton ZOX:[id number] DOB: [birth year] DOA: 09/23/2023  PCP: Twylla Galen, MD  Admit date: 09/23/2023 Discharge date: 09/26/2023  Disposition: PATIENT DISCHARGING AGAINST MEDICAL ADVICE HIGH RISK FOR RE-HOSPITALIZATION   Recommendations for Outpatient Follow-up:  Pt was advised to follow up with PCP asap in next week Pt was advised to follow up with [facility] [place] office in 1 week Pt was advised to follow up with [facility] office in 2 weeks  Pt was advised to follow up with [facility] to discuss the hepatitis C treatment he is seeking Recommended to patient to repeat BMP in next 1-3 days   Discharge Condition: GUARDED   CODE STATUS: Full DIET: renal heart healthy foods recommended    Brief Hospitalization Summary: Please see all hospital notes, images, labs for full details of the hospitalization. Admission provider HPI:  53 y.o. male with medical history significant of hypertension and  history of urinary retention who presented with difficulty urinating.  Patient developed acute sings of urinary retention with difficulty voiding, positive urinary dripping, frequent urination and pelvic pain.  Pain was worsening and radiating to his chest. It was severe in intensity and dull in nature. No improving or worsening factors.   Because persistent symptoms he called EMS. He was found in distress due to pain and was transported to the ED.    In the ED he was found to have urinary retention per point of care ultrasound, foley catheter was inserted with improvement in his symptoms.     He was hospitalized from 07/05/23 to 07/09/22 for urinary retention with urinary tract infection. He was discharged with foley catheter and flomax . Follow up with Urology as outpatient.  Per his report foley was removed one month later, and eventually he stopped taking flomax .    Hospital Course by listed problems addressed  Acute recurrent  Urinary retention He was hospitalized for same reason last year in March Apparently he was lost to outpatient follow up - pt admits he never sought to follow up   Foley catheter has been replaced and now urine flowing freely Monitor urine output and will get a formal renal ultrasound Restarted tamsulosin  0.4 mg daily  Will need outpatient follow up with Urology.   I received a call today at 3pm that patient is leaving AMA, I called him and was not able to convince him to remain in hospital for further labs and treatments.  He is discharging AMA after being counseled.  In my opinion he has decisional capacity to make his own medical decisions.  He was advised to follow up with PCP, urology, nephrology and GI.  He verbalized understanding regarding risks of leaving soon including death as we are currently treating him for hyperkalemia.     Hyperkalemia - potassium back up again today - discharge home held today - redosed lokelma  10 mg TID x 3 doses - recheck renal function panel in AM  - unfortunately paitent has decided to leave hospital AMA before completing the treatments.   He was counseled about the risks of not completing treatment and he verbalized understanding   Metabolic Acidosis - slowly improving - bicarbonate IV infusion ordered - recheck in AM  - pt decided to leave AMA today before completing treatment   UTI with pyuria Urinalysis with pyuria and positive nitrite, continue IV ceftriaxone  x 3 doses for urinary tract infection (present on admission with no sepsis)    CKD (chronic kidney disease) stage IV/V Hyponatremia, hyperkalemia,  non anion gap metabolic acidosis,  His discharge serum cr last year was 3.  Plan to continue to monitor urine output with foley catheter.  Sodium bicarbonate  infusion Follow up renal function and electrolytes, avoid hypotension and nephrotoxic medications Spoke with nephrologist Dr. Jearldine Mina, he recommended continue foley cath and outpatient  nephrology follow up  Pt is leaving hospital AMA.     Intake/Output Summary (Last 24 hours) at 09/26/2023 1018 Last data filed at 09/26/2023 0534    Gross per 24 hour  Intake 560.83 ml  Output 2600 ml  Net -2039.17 ml    Essential hypertension Pt reported no longer taking amlodipine  and hydralazine .  He has had some elevated BPs and started back on amlodipine  5 mg  Monitoring BP - better controlled now   Reported history of Hep C - pt said he has Hep C and never sought treatment and wanted it treated now - spoke with patient and told him he would need to follow up with GI clinic for this to be treated outpatient   Allergic Rhinitis - pt complaining of ear itching - examined ears with otoscope and both ears clear - nasal turbinates swollen and started on fluticasone nasal spray    Discharge Diagnoses:  Principal Problem:   Urinary retention Active Problems:   AKI (acute kidney injury) (HCC)   Essential hypertension   CKD (chronic kidney disease) stage 4, GFR 15-29 ml/min [facility])   Discharge Instructions: Discharge Instructions     Ambulatory referral to Gastroenterology   Complete by: As directed    What is the reason for referral?: Other   Ambulatory referral to Nephrology   Complete by: As directed    Hospital follow up   Ambulatory referral to Urology   Complete by: As directed    Hospital follow up urinary retention        Follow-up Information     [facility]. Schedule an appointment as soon as possible for a visit in 1 week(s).   Why: Hospital Follow Up Contact information: [address] [place]  [address] [phone]        [place]. Schedule an appointment as soon as possible for a visit in 2 week(s).   Why: Hospital Follow Up Contact information: [address] [place] [address] [phone]         [facility]. Schedule an appointment as  soon as possible for a visit in 1 month(s).   Why: GO FOR HEPATITIS C TREATMENT Contact information: [address] [place]  [address] [phone]        Twylla Galen, MD. Schedule an appointment as soon as possible for a visit in 1 week(s).   Specialty: Internal Medicine Why: Hospital Follow Up Contact information: [address] [place] [address] [phone]                Allergies  Allergen Reactions   Latex Hives   Shellfish Allergy Hives     Procedures/Studies: US  RENAL Result Date: 09/25/2023 CLINICAL DATA:  Acute kidney injury in the setting of urinary retention EXAM: RENAL / URINARY TRACT ULTRASOUND COMPLETE COMPARISON:  CT abdomen and pelvis dated 07/06/2022 FINDINGS: Right Kidney: Length = 11.4 cm Diffusely increased cortical echogenicity with diminished corticomedullary differentiation which can be seen with medical renal disease. Similar moderate to severe hydronephrosis. Partially imaged dilated proximal ureter. Left Kidney: Length = 14.1 cm Diffusely increased cortical echogenicity with diminished corticomedullary differentiation which can be  seen with medical renal disease. Similar moderate to severe hydronephrosis. Partially imaged dilated proximal ureter. Bladder: Decompressed urinary bladder with catheter in-situ demonstrates marked circumferential mural thickening. Other: None. IMPRESSION: 1. Similar moderate to severe bilateral hydronephrosis allowing for differences in technique. 2. Decompressed urinary bladder with catheter in-situ demonstrates marked circumferential mural thickening. 3. Diffusely increased cortical echogenicity with diminished corticomedullary differentiation which can be seen with medical renal disease. Electronically Signed   By: Limin  Xu M.D.   On: 09/25/2023 10:05     Subjective: Pt says he is no longer going to stay and is going home today.   Discharge Exam: Vitals:   09/26/23 0445 09/26/23 1528   BP: 134/85 119/82  Pulse: 80 72  Resp: 17 18  Temp: 98.7 F (37.1 C) 98.5 F (36.9 C)  SpO2: 100% 100%   Vitals:   09/25/23 1333 09/25/23 1957 09/26/23 0445 09/26/23 1528  BP: (!) 124/98 (!) 139/90 134/85 119/82  Pulse: 86 81 80 72  Resp:  17 17 18   Temp: 98.6 F (37 C) 98.3 F (36.8 C) 98.7 F (37.1 C) 98.5 F (36.9 C)  TempSrc: Oral Oral Oral Oral  SpO2: 100% 99% 100% 100%  Weight:      Height:        General: Pt is alert, awake, not in acute distress Cardiovascular: RRR, S1/S2 +, no rubs, no gallops Respiratory: CTA bilaterally, no wheezing, no rhonchi Abdominal: Soft, NT, ND, bowel sounds +\ GU: foley catheter in place  Extremities: no edema, no cyanosis   The results of significant diagnostics from this hospitalization (including imaging, microbiology, ancillary and laboratory) are listed below for reference.     Microbiology: Recent Results (from the past 240 hours)  MRSA Next Gen by PCR, Nasal     Status: None   Collection Time: 09/25/23  4:51 PM   Specimen: Nasal Mucosa; Nasal Swab  Result Value Ref Range Status   MRSA by PCR Next Gen NOT DETECTED NOT DETECTED Final    Comment: (NOTE) The GeneXpert MRSA Assay (FDA approved for NASAL specimens only), is one component of a comprehensive MRSA colonization surveillance program. It is not intended to diagnose MRSA infection nor to guide or monitor treatment for MRSA infections. Test performance is not FDA approved in patients less than 68 years old. Performed at [facility], [address], [place], [place] [address]      Labs: BNP (last 3 results) No results for input(s): "BNP" in the last 8760 hours. Basic Metabolic Panel: Recent Labs  Lab 09/23/23 2038 09/24/23 0700 09/25/23 0441 09/26/23 0354  NA 132* 136 134* 137  K 5.3* 5.6* 4.8 5.3*  CL 109 111 110 112*  CO2 14* 13* 13* 17*  GLUCOSE 125* 91 105* 87  BUN 63* 60* 49* 46*  CREATININE 8.74* 8.54* 7.19* 6.08*  CALCIUM 8.3* 8.8* 8.0* 7.9*   PHOS  --   --  5.6* 5.8*   Liver Function Tests: Recent Labs  Lab 09/25/23 0441 09/26/23 0354  ALBUMIN 2.3* 2.4*   No results for input(s): "LIPASE", "AMYLASE" in the last 168 hours. No results for input(s): "AMMONIA" in the last 168 hours. CBC: Recent Labs  Lab 09/23/23 2038 09/24/23 0700  WBC 12.8* 10.3  HGB 9.6* 10.8*  HCT 31.9* 36.1*  MCV 89.6 89.8  PLT 531* 757*   Cardiac Enzymes: No results for input(s): "CKTOTAL", "CKMB", "CKMBINDEX", "TROPONINI" in the last 168 hours. BNP: Invalid input(s): "POCBNP" CBG: No results for input(s): "GLUCAP" in the last  168 hours. D-Dimer No results for input(s): "DDIMER" in the last 72 hours. Hgb A1c No results for input(s): "HGBA1C" in the last 72 hours. Lipid Profile No results for input(s): "CHOL", "HDL", "LDLCALC", "TRIG", "CHOLHDL", "LDLDIRECT" in the last 72 hours. Thyroid function studies No results for input(s): "TSH", "T4TOTAL", "T3FREE", "THYROIDAB" in the last 72 hours.  Invalid input(s): "FREET3" Anemia work up No results for input(s): "VITAMINB12", "FOLATE", "FERRITIN", "TIBC", "IRON", "RETICCTPCT" in the last 72 hours. Urinalysis    Component Value Date/Time   COLORURINE YELLOW 09/23/2023 2301   APPEARANCEUR TURBID (A) 09/23/2023 2301   LABSPEC 1.005 09/23/2023 2301   PHURINE 5.0 09/23/2023 2301   GLUCOSEU NEGATIVE 09/23/2023 2301   HGBUR SMALL (A) 09/23/2023 2301   BILIRUBINUR NEGATIVE 09/23/2023 2301   KETONESUR NEGATIVE 09/23/2023 2301   PROTEINUR 30 (A) 09/23/2023 2301   UROBILINOGEN 0.2 06/17/2014 1046   NITRITE POSITIVE (A) 09/23/2023 2301   LEUKOCYTESUR LARGE (A) 09/23/2023 2301   Sepsis Labs Recent Labs  Lab 09/23/23 2038 09/24/23 0700  WBC 12.8* 10.3   Microbiology Recent Results (from the past 240 hours)  MRSA Next Gen by PCR, Nasal     Status: None   Collection Time: 09/25/23  4:51 PM   Specimen: Nasal Mucosa; Nasal Swab  Result Value Ref Range Status   MRSA by PCR Next Gen NOT  DETECTED NOT DETECTED Final    Comment: (NOTE) The GeneXpert MRSA Assay (FDA approved for NASAL specimens only), is one component of a comprehensive MRSA colonization surveillance program. It is not intended to diagnose MRSA infection nor to guide or monitor treatment for MRSA infections. Test performance is not FDA approved in patients less than 75 years old. Performed at [facility], [address], [place], [place] [address]    Time coordinating discharge:   SIGNED:  Faustino Hook, MD  [facility] 09/26/2023, 3:50 PM How to contact the [facility] Attending or Consulting provider 7A - 7P or covering provider during after hours 7P -7A, for this patient?  Check the care team in [facility] and look for a) attending/consulting TRH provider listed and b) the TRH team listed Log into [email or web address] and use [facility] universal password to access. If you do not have the password, please contact the hospital operator. Locate the TRH provider you are looking for under [facility] and page to a number that you can be directly reached. If you still have difficulty reaching the provider, please page the [facility] (Director on Call) for the Hospitalists listed on amion for assistance.

## 2023-09-26 NOTE — Progress Notes (Signed)
 PROGRESS NOTE   Wesley Sexton  WUJ:[id number] DOB: 12-08-1970 DOA: 09/23/2023 PCP: Twylla Galen, MD   Chief Complaint  Patient presents with   Abdominal Pain   Chest Pain   Level of care: Med-Surg  Brief Admission History:  53 y.o. male with medical history significant of hypertension and  history of urinary retention who presented with difficulty urinating.  Patient developed acute sings of urinary retention with difficulty voiding, positive urinary dripping, frequent urination and pelvic pain.  Pain was worsening and radiating to his chest. It was severe in intensity and dull in nature. No improving or worsening factors.   Because persistent symptoms he called EMS. He was found in distress due to pain and was transported to the ED.    In the ED he was found to have urinary retention per point of care ultrasound, foley catheter was inserted with improvement in his symptoms.     He was hospitalized from 07/05/23 to 07/09/22 for urinary retention with urinary tract infection. He was discharged with foley catheter and flomax . Follow up with Urology as outpatient.  Per his report foley was removed one month later, and eventually he stopped taking flomax .     Assessment and Plan:  Acute recurrent Urinary retention He was hospitalized for same reason last year in March Apparently he was lost to outpatient follow up - pt admits he never sought to follow up   Foley catheter has been replaced and now urine flowing freely Monitor urine output and will get a formal renal ultrasound Restarted tamsulosin  0.4 mg daily  Will need outpatient follow up with Urology.   Hyperkalemia - potassium back up again today - discharge home held today - redosed lokelma  10 mg TID x 3 doses - recheck renal function panel in AM   Metabolic Acidosis - slowly improving - bicarbonate IV infusion ordered - recheck in AM   UTI with pyuria Urinalysis with pyuria and positive nitrite, continue IV ceftriaxone  x 3  doses for urinary tract infection (present on admission with no sepsis)   CKD (chronic kidney disease) stage IV/V Hyponatremia, hyperkalemia, non anion gap metabolic acidosis,  His discharge serum cr last year was 3.  Plan to continue to monitor urine output with foley catheter.  Sodium bicarbonate  infusion Follow up renal function and electrolytes, avoid hypotension and nephrotoxic medications Spoke with nephrologist Dr. Jearldine Mina, he recommended continue foley cath and outpatient nephrology follow up    Intake/Output Summary (Last 24 hours) at 09/26/2023 1018 Last data filed at 09/26/2023 0534 Gross per 24 hour  Intake 560.83 ml  Output 2600 ml  Net -2039.17 ml   Essential hypertension Pt reported no longer taking amlodipine  and hydralazine .  He has had some elevated BPs and started back on amlodipine  5 mg  Monitoring BP - better controlled now  Reported history of Hep C - pt said he has Hep C and never sought treatment and wanted it treated now - spoke with patient and told him he would need to follow up with GI clinic for this to be treated outpatient  Allergic Rhinitis - pt complaining of ear itching - examined ears with otoscope and both ears clear - nasal turbinates swollen and started on fluticasone nasal spray   DVT prophylaxis: enoxaparin Code Status: Full  Communication: updated patient with plan of care and he verbalized understanding Disposition: anticipating home tomorrow if labs improved    Consultants:   Procedures:   Antimicrobials:  Ceftriaxone  6/8>>6/10  Subjective: Pt was having itching in  the ears yesterday and wanted his hep C treated.     Objective: Vitals:   09/25/23 0913 09/25/23 1333 09/25/23 1957 09/26/23 0445  BP: 136/86 (!) 124/98 (!) 139/90 134/85  Pulse: 80 86 81 80  Resp:   17 17  Temp:  98.6 F (37 C) 98.3 F (36.8 C) 98.7 F (37.1 C)  TempSrc:  Oral Oral Oral  SpO2: 100% 100% 99% 100%  Weight:      Height:         Intake/Output Summary (Last 24 hours) at 09/26/2023 1012 Last data filed at 09/26/2023 0534 Gross per 24 hour  Intake 560.83 ml  Output 2600 ml  Net -2039.17 ml   Filed Weights   09/23/23 2016 09/24/23 1603  Weight: 72.6 kg 60.7 kg   Examination:  General exam: Appears calm and comfortable  Respiratory system: Clear to auscultation. Respiratory effort normal. Cardiovascular system: normal S1 & S2 heard. No JVD, murmurs, rubs, gallops or clicks. No pedal edema. Gastrointestinal system: Abdomen is nondistended, soft and nontender. No organomegaly or masses felt. Normal bowel sounds heard. GU: foley in place with light amber urine seen.  Central nervous system: Alert and oriented. No focal neurological deficits. Extremities: Symmetric 5 x 5 power. Skin: No rashes, lesions or ulcers. Psychiatry: Judgement and insight appear normal. Mood & affect appropriate.   Data Reviewed: I have personally reviewed following labs and imaging studies  CBC: Recent Labs  Lab 09/23/23 2038 09/24/23 0700  WBC 12.8* 10.3  HGB 9.6* 10.8*  HCT 31.9* 36.1*  MCV 89.6 89.8  PLT 531* 757*    Basic Metabolic Panel: Recent Labs  Lab 09/23/23 2038 09/24/23 0700 09/25/23 0441 09/26/23 0354  NA 132* 136 134* 137  K 5.3* 5.6* 4.8 5.3*  CL 109 111 110 112*  CO2 14* 13* 13* 17*  GLUCOSE 125* 91 105* 87  BUN 63* 60* 49* 46*  CREATININE 8.74* 8.54* 7.19* 6.08*  CALCIUM 8.3* 8.8* 8.0* 7.9*  PHOS  --   --  5.6* 5.8*    CBG: No results for input(s): "GLUCAP" in the last 168 hours.  Recent Results (from the past 240 hours)  MRSA Next Gen by PCR, Nasal     Status: None   Collection Time: 09/25/23  4:51 PM   Specimen: Nasal Mucosa; Nasal Swab  Result Value Ref Range Status   MRSA by PCR Next Gen NOT DETECTED NOT DETECTED Final    Comment: (NOTE) The GeneXpert MRSA Assay (FDA approved for NASAL specimens only), is one component of a comprehensive MRSA colonization surveillance program. It is  not intended to diagnose MRSA infection nor to guide or monitor treatment for MRSA infections. Test performance is not FDA approved in patients less than 44 years old. Performed at [facility], [address] [place], [place] [address]      Radiology Studies: US  RENAL Result Date: 09/25/2023 CLINICAL DATA:  Acute kidney injury in the setting of urinary retention EXAM: RENAL / URINARY TRACT ULTRASOUND COMPLETE COMPARISON:  CT abdomen and pelvis dated 07/06/2022 FINDINGS: Right Kidney: Length = 11.4 cm Diffusely increased cortical echogenicity with diminished corticomedullary differentiation which can be seen with medical renal disease. Similar moderate to severe hydronephrosis. Partially imaged dilated proximal ureter. Left Kidney: Length = 14.1 cm Diffusely increased cortical echogenicity with diminished corticomedullary differentiation which can be seen with medical renal disease. Similar moderate to severe hydronephrosis. Partially imaged dilated proximal ureter. Bladder: Decompressed urinary bladder with catheter in-situ demonstrates marked circumferential mural thickening. Other:  None. IMPRESSION: 1. Similar moderate to severe bilateral hydronephrosis allowing for differences in technique. 2. Decompressed urinary bladder with catheter in-situ demonstrates marked circumferential mural thickening. 3. Diffusely increased cortical echogenicity with diminished corticomedullary differentiation which can be seen with medical renal disease. Electronically Signed   By: Limin  Xu M.D.   On: 09/25/2023 10:05    Scheduled Meds:  amLODipine   5 mg Oral Daily   Chlorhexidine  Gluconate Cloth  6 each Topical Daily   enoxaparin (LOVENOX) injection  30 mg Subcutaneous Q24H   fluticasone  2 spray Each Nare Daily   loratadine  10 mg Oral Q48H   sodium zirconium cyclosilicate   10 g Oral TID   tamsulosin   0.4 mg Oral QPC supper   Continuous Infusions:  sodium bicarbonate  150 mEq in sterile water  1,150 mL infusion        LOS: 2 days   Time spent: 55 mins  Starleen Trussell Lincoln Renshaw, MD How to contact the [facility] Attending or Consulting provider 7A - 7P or covering provider during after hours 7P -7A, for this patient?  Check the care team in [facility] and look for a) attending/consulting [facility] provider listed and b) the [facility] team listed Log into [email or web address] to find provider on call.  Locate the [facility] provider you are looking for under [facility] and page to a number that you can be directly reached. If you still have difficulty reaching the provider, please page the [facility] (Director on Call) for the Hospitalists listed on amion for assistance.  09/26/2023, 10:12 AM

## 2023-09-27 ENCOUNTER — Encounter: Payer: Self-pay | Admitting: Internal Medicine

## 2023-10-11 ENCOUNTER — Ambulatory Visit: Admitting: Internal Medicine

## 2023-10-12 ENCOUNTER — Encounter: Payer: Self-pay | Admitting: Internal Medicine

## 2023-10-17 ENCOUNTER — Encounter: Payer: Self-pay | Admitting: Internal Medicine

## 2023-10-18 ENCOUNTER — Ambulatory Visit: Admitting: Urology

## 2023-10-18 DIAGNOSIS — N138 Other obstructive and reflux uropathy: Secondary | ICD-10-CM | POA: Insufficient documentation

## 2023-10-18 DIAGNOSIS — Z91199 Patient's noncompliance with other medical treatment and regimen due to unspecified reason: Secondary | ICD-10-CM | POA: Insufficient documentation

## 2023-10-18 NOTE — Progress Notes (Deleted)
 Name: Wesley Sexton DOB: October 20, 1970 MRN: [id number]  History of Present Illness: Mr. Arko is a 53 y.o. male who presents today as a new patient at [facility] [place]. All available relevant medical records have been reviewed.  Relevant History includes: 1. BPH with BOO & LUTS including incomplete bladder emptying with episodes of acute urinary retention. 2. Chronic bilateral hydronephrosis. Likely secondary to #1. 3. CKD stage 4/5.  Recent history:  > 09/23/2023 - 09/26/2023: - Admitted for acute urinary retention resulting in AKI on CKD stage 4/5; also had metabolic acidosis, hyperkalemia, hyponatremia.  - He was hospitalized for same reason last year in March Apparently he was lost to outpatient follow up - pt admits he never sought to follow up.  - Urinalysis with pyuria and positive nitrite, continue IV ceftriaxone  x 3 doses for urinary tract infection (present on admission with no sepsis). - Foley catheter placed symptomatic improvement.  - RUS on 09/25/2023 showed moderate to severe bilateral hydronephrosis, marked circumferential bladder wall thickening, and diffusely increased cortical echogenicity with diminished corticomedullary differentiation which can be seen with medical renal disease. - Restarted tamsulosin  0.4 mg daily.  - Patient left hospital AMA.   Today: He reports ***  He {Actions; denies-reports:120008} urinary urgency, frequency, nocturia x***, dysuria, gross hematuria, ***weak urinary stream, hesitancy, straining to void, or sensations of incomplete emptying.  He {Actions; denies-reports:120008} flank pain or abdominal pain. He {Actions; denies-reports:120008} fevers, nausea, or vomiting.   ***keep catheter for now ***RUS to see if bilateral hydronephrosis improved with catheter in place ***double Flomax  ***cystoscopy   Medications: Current Outpatient Medications  Medication Sig Dispense Refill   amLODipine  (NORVASC ) 5 MG tablet Take 1  tablet (5 mg total) by mouth daily. (Patient not taking: Reported on 09/24/2023) 30 tablet 0   hydrALAZINE  (APRESOLINE ) 100 MG tablet Take 1 tablet (100 mg total) by mouth every 8 (eight) hours. (Patient not taking: Reported on 09/24/2023) 90 tablet 0   tamsulosin  (FLOMAX ) 0.4 MG CAPS capsule Take 1 capsule (0.4 mg total) by mouth daily after supper. 30 capsule 0   No current facility-administered medications for this visit.    Allergies: Allergies  Allergen Reactions   Latex Hives   Shellfish Allergy Hives    Past Medical History:  Diagnosis Date   Hypertension    Pancreatitis    Past Surgical History:  Procedure Laterality Date   pin hole in heart     LEG SURGERY     No family history on file. Social History   Socioeconomic History   Marital status: Divorced    Spouse name: Not on file   Number of children: Not on file   Years of education: Not on file   Highest education level: Not on file  Occupational History   Not on file  Tobacco Use   Smoking status: Every Day    Current packs/day: 0.50    Types: Cigarettes   Smokeless tobacco: Never  Vaping Use   Vaping status: Never Used  Substance and Sexual Activity   Alcohol use: Yes    Comment: Drank beer last night   Drug use: Yes    Types: Cocaine, Marijuana    Comment: used 4/5 days ago   Sexual activity: Not on file  Other Topics Concern   Not on file  Social History Narrative   Not on file   Social Drivers of Health   Financial Resource Strain: Not on file  Food Insecurity: No Food Insecurity (09/24/2023)   Hunger  Vital Sign    Worried About Programme researcher, broadcasting/film/video in the Last Year: Never true    Ran Out of Food in the Last Year: Never true  Transportation Needs: No Transportation Needs (09/24/2023)   PRAPARE - Administrator, Civil Service (Medical): No    Lack of Transportation (Non-Medical): No  Physical Activity: Not on file  Stress: Not on file  Social Connections: Not on file  Intimate  Partner Violence: Not At Risk (09/24/2023)   Humiliation, Afraid, Rape, and Kick questionnaire    Fear of Current or Ex-Partner: No    Emotionally Abused: No    Physically Abused: No    Sexually Abused: No    SUBJECTIVE  Review of Systems Constitutional: Patient denies any unintentional weight loss or change in strength lntegumentary: Patient denies any rashes or pruritus Cardiovascular: Patient denies chest pain or syncope Respiratory: Patient denies shortness of breath Gastrointestinal: ***Patient denies nausea, vomiting, constipation, or diarrhea ***As per HPI Musculoskeletal: Patient denies muscle cramps or weakness Neurologic: Patient denies convulsions or seizures Allergic/Immunologic: Patient denies recent allergic reaction(s) Hematologic/Lymphatic: Patient denies bleeding tendencies Endocrine: Patient denies heat/cold intolerance  GU: As per HPI.  OBJECTIVE There were no vitals filed for this visit. There is no height or weight on file to calculate BMI.  Physical Examination Constitutional: No obvious distress; patient is non-toxic appearing  Cardiovascular: No visible lower extremity edema.  Respiratory: The patient does not have audible wheezing/stridor; respirations do not appear labored  Gastrointestinal: Abdomen non-distended Musculoskeletal: Normal ROM of UEs  Skin: No obvious rashes/open sores  Neurologic: CN 2-12 grossly intact Psychiatric: Answered questions appropriately with normal affect  Hematologic/Lymphatic/Immunologic: No obvious bruises or sites of spontaneous bleeding  UA: ***negative ***positive for *** leukocytes, *** blood, ***nitrites Urine microscopy: *** WBC/hpf, *** RBC/hpf, *** bacteria ***glucosuria (secondary to ***Jardiance ***Farxiga use) ***otherwise unremarkable  PVR: *** ml  ASSESSMENT Obstructive uropathy  Urinary retention  Bilateral hydronephrosis  CKD (chronic kidney disease) stage 4, GFR 15-29 ml/min [facility])  Medical  non-compliance  Hospital discharge follow-up ***  We agreed to plan for follow up in *** months or sooner if needed. Patient verbalized understanding of and agreement with current plan. All questions were answered.  PLAN Advised the following: *** ***No follow-ups on file.  No orders of the defined types were placed in this encounter.   It has been explained that the patient is to follow regularly with their PCP in addition to all other providers involved in their care and to follow instructions provided by these respective offices. Patient advised to contact urology clinic if any urologic-pertaining questions, concerns, new symptoms or problems arise in the interim period.  There are no Patient Instructions on file for this visit.  Electronically signed by:  Lauraine KYM Oz, MSN, FNP-C, CUNP 10/18/2023 10:06 AM

## 2023-10-25 ENCOUNTER — Ambulatory Visit: Admitting: Urology

## 2024-03-12 ENCOUNTER — Encounter (HOSPITAL_COMMUNITY): Payer: Self-pay | Admitting: Emergency Medicine

## 2024-03-12 ENCOUNTER — Inpatient Hospital Stay (HOSPITAL_COMMUNITY)
Admission: EM | Admit: 2024-03-12 | Discharge: 2024-03-14 | DRG: 683 | Disposition: A | Discharge status: Home / Self Care | Attending: Family Medicine | Admitting: Family Medicine

## 2024-03-12 ENCOUNTER — Emergency Department (HOSPITAL_COMMUNITY)

## 2024-03-12 ENCOUNTER — Other Ambulatory Visit: Payer: Self-pay

## 2024-03-12 DIAGNOSIS — R748 Abnormal levels of other serum enzymes: Secondary | ICD-10-CM | POA: Diagnosis present

## 2024-03-12 DIAGNOSIS — D631 Anemia in chronic kidney disease: Secondary | ICD-10-CM | POA: Diagnosis present

## 2024-03-12 DIAGNOSIS — N185 Chronic kidney disease, stage 5: Secondary | ICD-10-CM | POA: Diagnosis present

## 2024-03-12 DIAGNOSIS — N189 Chronic kidney disease, unspecified: Principal | ICD-10-CM

## 2024-03-12 DIAGNOSIS — N184 Chronic kidney disease, stage 4 (severe): Secondary | ICD-10-CM | POA: Diagnosis present

## 2024-03-12 DIAGNOSIS — Z91013 Allergy to seafood: Secondary | ICD-10-CM

## 2024-03-12 DIAGNOSIS — N390 Urinary tract infection, site not specified: Secondary | ICD-10-CM

## 2024-03-12 DIAGNOSIS — E872 Acidosis, unspecified: Secondary | ICD-10-CM | POA: Diagnosis present

## 2024-03-12 DIAGNOSIS — N401 Enlarged prostate with lower urinary tract symptoms: Secondary | ICD-10-CM | POA: Diagnosis present

## 2024-03-12 DIAGNOSIS — N1330 Unspecified hydronephrosis: Secondary | ICD-10-CM | POA: Diagnosis present

## 2024-03-12 DIAGNOSIS — Z79899 Other long term (current) drug therapy: Secondary | ICD-10-CM

## 2024-03-12 DIAGNOSIS — N179 Acute kidney failure, unspecified: Principal | ICD-10-CM | POA: Diagnosis present

## 2024-03-12 DIAGNOSIS — F17210 Nicotine dependence, cigarettes, uncomplicated: Secondary | ICD-10-CM | POA: Diagnosis present

## 2024-03-12 DIAGNOSIS — E875 Hyperkalemia: Secondary | ICD-10-CM | POA: Diagnosis present

## 2024-03-12 DIAGNOSIS — Z91040 Latex allergy status: Secondary | ICD-10-CM

## 2024-03-12 DIAGNOSIS — I120 Hypertensive chronic kidney disease with stage 5 chronic kidney disease or end stage renal disease: Secondary | ICD-10-CM | POA: Diagnosis present

## 2024-03-12 DIAGNOSIS — N138 Other obstructive and reflux uropathy: Secondary | ICD-10-CM | POA: Diagnosis present

## 2024-03-12 DIAGNOSIS — N136 Pyonephrosis: Secondary | ICD-10-CM | POA: Diagnosis present

## 2024-03-12 DIAGNOSIS — I10 Essential (primary) hypertension: Secondary | ICD-10-CM | POA: Diagnosis present

## 2024-03-12 LAB — COMPREHENSIVE METABOLIC PANEL WITH GFR
ALT: 17 U/L (ref 0–44)
AST: 23 U/L (ref 15–41)
Albumin: 4 g/dL (ref 3.5–5.0)
Alkaline Phosphatase: 172 U/L — ABNORMAL HIGH (ref 38–126)
Anion gap: 12 (ref 5–15)
BUN: 51 mg/dL — ABNORMAL HIGH (ref 6–20)
CO2: 13 mmol/L — ABNORMAL LOW (ref 22–32)
Calcium: 7.9 mg/dL — ABNORMAL LOW (ref 8.9–10.3)
Chloride: 111 mmol/L (ref 98–111)
Creatinine, Ser: 7.05 mg/dL — ABNORMAL HIGH (ref 0.61–1.24)
GFR, Estimated: 9 mL/min — ABNORMAL LOW (ref >60)
Glucose, Bld: 96 mg/dL (ref 70–99)
Potassium: 5.5 mmol/L — ABNORMAL HIGH (ref 3.5–5.1)
Sodium: 136 mmol/L (ref 135–145)
Total Bilirubin: 0.3 mg/dL (ref 0.0–1.2)
Total Protein: 7.1 g/dL (ref 6.5–8.1)

## 2024-03-12 LAB — CBC WITH DIFFERENTIAL/PLATELET
Abs Immature Granulocytes: 0.02 K/uL (ref 0.00–0.07)
Basophils Absolute: 0 K/uL (ref 0.0–0.1)
Basophils Relative: 0 %
Eosinophils Absolute: 0.1 K/uL (ref 0.0–0.5)
Eosinophils Relative: 1 %
HCT: 34.2 % — ABNORMAL LOW (ref 39.0–52.0)
Hemoglobin: 10.2 g/dL — ABNORMAL LOW (ref 13.0–17.0)
Immature Granulocytes: 0 %
Lymphocytes Relative: 9 %
Lymphs Abs: 0.9 K/uL (ref 0.7–4.0)
MCH: 27.1 pg (ref 26.0–34.0)
MCHC: 29.8 g/dL — ABNORMAL LOW (ref 30.0–36.0)
MCV: 91 fL (ref 80.0–100.0)
Monocytes Absolute: 1.3 K/uL — ABNORMAL HIGH (ref 0.1–1.0)
Monocytes Relative: 13 %
Neutro Abs: 7.4 K/uL (ref 1.7–7.7)
Neutrophils Relative %: 77 %
Platelets: 327 K/uL (ref 150–400)
RBC: 3.76 MIL/uL — ABNORMAL LOW (ref 4.22–5.81)
RDW: 16.3 % — ABNORMAL HIGH (ref 11.5–15.5)
WBC: 9.8 K/uL (ref 4.0–10.5)
nRBC: 0 % (ref 0.0–0.2)

## 2024-03-12 LAB — URINALYSIS, ROUTINE W REFLEX MICROSCOPIC
Bilirubin Urine: NEGATIVE
Glucose, UA: NEGATIVE mg/dL
Ketones, ur: NEGATIVE mg/dL
Nitrite: NEGATIVE
Protein, ur: 30 mg/dL — AB
Specific Gravity, Urine: 1.005 (ref 1.005–1.030)
WBC, UA: >50 WBC/hpf (ref 0–5)
pH: 6 (ref 5.0–8.0)

## 2024-03-12 LAB — LIPASE, BLOOD: Lipase: 659 U/L — ABNORMAL HIGH (ref 11–51)

## 2024-03-12 LAB — POTASSIUM: Potassium: 5.3 mmol/L — ABNORMAL HIGH (ref 3.5–5.1)

## 2024-03-12 MED ORDER — SODIUM ZIRCONIUM CYCLOSILICATE 10 G PO PACK
10.0000 g | PACK | Freq: Once | ORAL | Status: AC
  Administered 2024-03-12: 10 g via ORAL
  Filled 2024-03-12: qty 1

## 2024-03-12 MED ORDER — ACETAMINOPHEN 650 MG RE SUPP
650.0000 mg | Freq: Four times a day (QID) | RECTAL | Status: DC | PRN
Start: 2024-03-12 — End: 2024-03-14

## 2024-03-12 MED ORDER — TAMSULOSIN HCL 0.4 MG PO CAPS
0.4000 mg | ORAL_CAPSULE | Freq: Every day | ORAL | Status: DC
  Administered 2024-03-12: 0.4 mg via ORAL
  Filled 2024-03-12: qty 1

## 2024-03-12 MED ORDER — LACTATED RINGERS IV BOLUS
1000.0000 mL | Freq: Once | INTRAVENOUS | Status: AC
  Administered 2024-03-12: 1000 mL via INTRAVENOUS

## 2024-03-12 MED ORDER — HEPARIN SODIUM (PORCINE) 5000 UNIT/ML IJ SOLN
5000.0000 [IU] | Freq: Three times a day (TID) | INTRAMUSCULAR | Status: DC
  Administered 2024-03-12 – 2024-03-14 (×4): 5000 [IU] via SUBCUTANEOUS
  Filled 2024-03-12 (×5): qty 1

## 2024-03-12 MED ORDER — ONDANSETRON HCL 4 MG PO TABS: 4.0000 mg | ORAL_TABLET | Freq: Four times a day (QID) | ORAL | Status: DC | PRN

## 2024-03-12 MED ORDER — ONDANSETRON HCL 4 MG/2ML IJ SOLN: 4.0000 mg | Freq: Four times a day (QID) | INTRAMUSCULAR | Status: DC | PRN

## 2024-03-12 MED ORDER — SODIUM ZIRCONIUM CYCLOSILICATE 5 G PO PACK
10.0000 g | PACK | Freq: Once | ORAL | Status: AC
  Administered 2024-03-12: 10 g via ORAL
  Filled 2024-03-12: qty 2

## 2024-03-12 MED ORDER — SODIUM CHLORIDE 0.9 % IV SOLN
1.0000 g | INTRAVENOUS | Status: AC
  Administered 2024-03-13 – 2024-03-14 (×2): 1 g via INTRAVENOUS
  Filled 2024-03-12 (×2): qty 10

## 2024-03-12 MED ORDER — HYDRALAZINE HCL 25 MG PO TABS
100.0000 mg | ORAL_TABLET | Freq: Three times a day (TID) | ORAL | Status: DC
  Administered 2024-03-12 – 2024-03-14 (×6): 100 mg via ORAL
  Filled 2024-03-12 (×7): qty 4

## 2024-03-12 MED ORDER — HYDRALAZINE HCL 20 MG/ML IJ SOLN: 10.0000 mg | INTRAMUSCULAR | Status: DC | PRN

## 2024-03-12 MED ORDER — ACETAMINOPHEN 325 MG PO TABS
650.0000 mg | ORAL_TABLET | Freq: Four times a day (QID) | ORAL | Status: DC | PRN
Start: 2024-03-12 — End: 2024-03-14
  Administered 2024-03-12 – 2024-03-13 (×2): 650 mg via ORAL
  Filled 2024-03-12 (×2): qty 2

## 2024-03-12 MED ORDER — SODIUM CHLORIDE 0.45 % IV SOLN
INTRAVENOUS | Status: AC
  Filled 2024-03-12 (×2): qty 75

## 2024-03-12 MED ORDER — SODIUM CHLORIDE 0.9 % IV SOLN
1.0000 g | Freq: Once | INTRAVENOUS | Status: AC
  Administered 2024-03-12: 1 g via INTRAVENOUS
  Filled 2024-03-12: qty 10

## 2024-03-12 MED ORDER — LABETALOL HCL 5 MG/ML IV SOLN: 10.0000 mg | INTRAVENOUS | Status: DC | PRN

## 2024-03-12 MED ORDER — AMLODIPINE BESYLATE 5 MG PO TABS
5.0000 mg | ORAL_TABLET | Freq: Every day | ORAL | Status: DC
  Administered 2024-03-12 – 2024-03-14 (×3): 5 mg via ORAL
  Filled 2024-03-12 (×3): qty 1

## 2024-03-12 NOTE — H&P (Signed)
 History and Physical    Quang Thorpe FMW:[id number] DOB: 1971-01-18 DOA: 03/12/2024  PCP: Katrinka Aquas, MD   Patient coming from: Home  Chief Complaint: Abnormal lab work at PCP office 11/24  HPI: Wesley Sexton is a 53 y.o. male with medical history significant for hypertension, chronic bilateral hydroureteronephrosis, CKD stage V, and tobacco abuse who was apparently being seen by his PCP yesterday for regular checkup and had lab work drawn.  He was called today regarding abnormal lab work to include elevated creatinine levels and was told to come to the ED for evaluation.  He complains of minimal suprapubic abdominal pain, but no other dysuria or hematuria. Patient states that he has been voiding fine despite the fact that he ran out of his tamsulosin  a few days ago.  He states that he has not been following up with a nephrologist regarding his kidney function.  He apparently stated to EDP that he was nauseous and vomiting, however denies this at this time.  He denies any other fevers, chills, chest pain, or shortness of breath.  He continues to smoke 12 cigarettes/day and denies any alcohol use.  He states he has been compliant with his home blood pressure medications.   ED Course: Vital signs stable and patient is afebrile and on room air.  Hemoglobin 10.2, potassium 5.5, creatinine 7.05 and noted to be 6 back on 09/2023.  Lipase of 650 with alkaline phosphatase elevation noted as well.  CT scan with bilateral hydroureteronephrosis and no other acute findings noted.  Urinalysis concerning for UTI and patient started on Rocephin  and was also given 1 L of IV fluid as well as Lokelma .  EKG with no concerning findings.  Review of Systems: Reviewed as noted above, otherwise negative.  Past Medical History:  Diagnosis Date   Hypertension    Pancreatitis     Past Surgical History:  Procedure Laterality Date   pin hole in heart     LEG SURGERY       reports that he has been smoking  cigarettes. He has never used smokeless tobacco. He reports current alcohol use. He reports current drug use. Drugs: Cocaine and Marijuana.  Allergies  Allergen Reactions   Latex Hives   Shellfish Allergy Hives    History reviewed. No pertinent family history.  Prior to Admission medications   Medication Sig Start Date End Date Taking? Authorizing Provider  amLODipine  (NORVASC ) 5 MG tablet Take 1 tablet (5 mg total) by mouth daily. Patient not taking: Reported on 09/24/2023 07/09/22 08/08/22  Maree Bracken D, DO  hydrALAZINE  (APRESOLINE ) 100 MG tablet Take 1 tablet (100 mg total) by mouth every 8 (eight) hours. Patient not taking: Reported on 09/24/2023 07/09/22 08/08/22  Maree Bracken D, DO  tamsulosin  (FLOMAX ) 0.4 MG CAPS capsule Take 1 capsule (0.4 mg total) by mouth daily after supper. 09/26/23   Vicci Afton CROME, MD    Physical Exam: Vitals:   03/12/24 1025 03/12/24 1100  BP: (!) 146/115 (!) 147/97  Pulse: 86 79  Temp: 98.5 F (36.9 C)   TempSrc: Oral   SpO2: 97% 100%  Weight: 68 kg   Height: 5' 8 (1.727 m)     Constitutional: NAD, calm, comfortable Vitals:   03/12/24 1025 03/12/24 1100  BP: (!) 146/115 (!) 147/97  Pulse: 86 79  Temp: 98.5 F (36.9 C)   TempSrc: Oral   SpO2: 97% 100%  Weight: 68 kg   Height: 5' 8 (1.727 m)    Eyes: lids and conjunctivae  normal Neck: normal, supple Respiratory: clear to auscultation bilaterally. Normal respiratory effort. No accessory muscle use.  Cardiovascular: Regular rate and rhythm, no murmurs. Abdomen: no tenderness, no distention. Bowel sounds positive.  Musculoskeletal:  No edema. Skin: no rashes, lesions, ulcers.  Psychiatric: Flat affect  Labs on Admission: I have personally reviewed following labs and imaging studies  CBC: Recent Labs  Lab 03/12/24 1051  WBC 9.8  NEUTROABS 7.4  HGB 10.2*  HCT 34.2*  MCV 91.0  PLT 327   Basic Metabolic Panel: Recent Labs  Lab 03/12/24 1051  NA 136  K 5.5*  CL 111  CO2  13*  GLUCOSE 96  BUN 51*  CREATININE 7.05*  CALCIUM 7.9*   GFR: Estimated Creatinine Clearance: 11.8 mL/min (A) (by C-G formula based on SCr of 7.05 mg/dL (H)). Liver Function Tests: Recent Labs  Lab 03/12/24 1051  AST 23  ALT 17  ALKPHOS 172*  BILITOT 0.3  PROT 7.1  ALBUMIN 4.0   Recent Labs  Lab 03/12/24 1051  LIPASE 659*   No results for input(s): AMMONIA in the last 168 hours. Coagulation Profile: No results for input(s): INR, PROTIME in the last 168 hours. Cardiac Enzymes: No results for input(s): CKTOTAL, CKMB, CKMBINDEX, TROPONINI in the last 168 hours. BNP (last 3 results) No results for input(s): PROBNP in the last 8760 hours. HbA1C: No results for input(s): HGBA1C in the last 72 hours. CBG: No results for input(s): GLUCAP in the last 168 hours. Lipid Profile: No results for input(s): CHOL, HDL, LDLCALC, TRIG, CHOLHDL, LDLDIRECT in the last 72 hours. Thyroid Function Tests: No results for input(s): TSH, T4TOTAL, FREET4, T3FREE, THYROIDAB in the last 72 hours. Anemia Panel: No results for input(s): VITAMINB12, FOLATE, FERRITIN, TIBC, IRON, RETICCTPCT in the last 72 hours. Urine analysis:    Component Value Date/Time   COLORURINE YELLOW 03/12/2024 1300   APPEARANCEUR TURBID (A) 03/12/2024 1300   LABSPEC 1.005 03/12/2024 1300   PHURINE 6.0 03/12/2024 1300   GLUCOSEU NEGATIVE 03/12/2024 1300   HGBUR SMALL (A) 03/12/2024 1300   BILIRUBINUR NEGATIVE 03/12/2024 1300   KETONESUR NEGATIVE 03/12/2024 1300   PROTEINUR 30 (A) 03/12/2024 1300   UROBILINOGEN 0.2 06/17/2014 1046   NITRITE NEGATIVE 03/12/2024 1300   LEUKOCYTESUR LARGE (A) 03/12/2024 1300    Radiological Exams on Admission: CT Renal Stone Study Result Date: 03/12/2024 CLINICAL DATA:  Abdominal/flank pain, stone suspected Elevated creatinine.  Left lower abdominal pain. EXAM: CT ABDOMEN AND PELVIS WITHOUT CONTRAST TECHNIQUE: Multidetector CT  imaging of the abdomen and pelvis was performed following the standard protocol without IV contrast. RADIATION DOSE REDUCTION: This exam was performed according to the departmental dose-optimization program which includes automated exposure control, adjustment of the mA and/or kV according to patient size and/or use of iterative reconstruction technique. COMPARISON:  Renal ultrasound 09/25/2023 and abdominopelvic CT 07/06/2022. FINDINGS: Lower chest: Clear lung bases. No significant pleural or pericardial effusion. Hepatobiliary: The liver has a non cirrhotic morphology without suspicious focal abnormality on noncontrast imaging. The gallbladder is incompletely distended. No evidence of gallstones or biliary dilatation. Pancreas: Unremarkable. No pancreatic ductal dilatation or surrounding inflammatory changes. Spleen: Normal in size without focal abnormality. Adrenals/Urinary Tract: Both adrenal glands appear normal. Severe chronic bilateral hydronephrosis and hydroureter again noted, similar to previous CT. There is progressive right renal cortical thinning. No significant perinephric soft tissue stranding. No urinary tract calculi are demonstrated. Both ureters are dilated to the bladder which is mildly distended with irregular wall thickening. Stomach/Bowel: No enteric contrast administered.  Stable chronic wall thickening in the stomach. No bowel distension, wall thickening or surrounding inflammation. The appendix appears normal. Vascular/Lymphatic: Interval lobe element of mild left periaortic adenopathy, largest node having a short axis dimension of 1.5 cm on image 31/2. No other enlarged lymph nodes identified in the abdomen or pelvis. Aortic and branch vessel atherosclerosis without evidence of aneurysm. Reproductive: Stable mild enlargement of the prostate gland without surrounding inflammation. Other: No evidence of abdominal wall mass or hernia. No ascites or pneumoperitoneum. Musculoskeletal: No acute  or significant osseous findings. IMPRESSION: 1. Severe chronic bilateral hydronephrosis and hydroureter, similar to previous CT. There is progressive right renal cortical thinning. No urinary tract calculi or definite acute findings demonstrated. The hydronephrosis may be secondary to chronic bladder outlet obstruction or a neurogenic bladder. Recommend correlation with previous clinical evaluation. If clinical concern for acute superimposed infection, recommend correlation with urine analysis. 2. Mildly distended bladder with irregular wall thickening, similar to previous study. 3. Interval development of mild left periaortic adenopathy, nonspecific. 4. Stable chronic wall thickening in the stomach. 5.  Aortic Atherosclerosis (ICD10-I70.0). Electronically Signed   By: Elsie Perone M.D.   On: 03/12/2024 12:33    EKG: Independently reviewed. SR 84bpm.  Assessment/Plan Principal Problem:   AKI (acute kidney injury) Active Problems:   CKD (chronic kidney disease) stage 4, GFR 15-29 ml/min (HCC)   Essential hypertension   Bilateral hydronephrosis   Hyperkalemia   Benign prostatic hyperplasia with urinary obstruction    AKI on CKD stage IV/V with hyperkalemia and metabolic acidosis - Received 1 L fluid bolus in ED - Maintain on bicarbonate infusion - Lokelma  given in ED, follow repeat potassium levels -Monitor on telemetry - Monitor I's and O's - Appreciate nephrology consultation  Lipase elevation/alkaline phosphatase elevation - Denies any nausea or vomiting - Has mild suprapubic abdominal pain - Continue gentle IV fluid and maintain on renal diet for now - Antiemetics as needed -Monitor a.m. lab work  UTI - Started on Rocephin  empirically - Obtain urine cultures and monitor  Hypertension - Continue amlodipine  and hydralazine  - IV hydralazine  as needed for severe elevations  Chronic bilateral hydro ureteral nephrosis - Has seen urology in the past and denies any difficulty  with urination - Bladder scan with only 160 cc of urine - Ran out of tamsulosin  4 days ago, continue  Tobacco abuse - Smokes 12 cigarettes/day - Counseled on cessation   DVT prophylaxis: Heparin  Code Status: Full Family Communication: None at bedside Disposition Plan: Admit for evaluation and management of AKI Consults called: Nephrology-discussed with Dr. Norine Admission status: Obs, Tele  Severity of Illness: The appropriate patient status for this patient is OBSERVATION. Observation status is judged to be reasonable and necessary in order to provide the required intensity of service to ensure the patient's safety. The patient's presenting symptoms, physical exam findings, and initial radiographic and laboratory data in the context of their medical condition is felt to place them at decreased risk for further clinical deterioration. Furthermore, it is anticipated that the patient will be medically stable for discharge from the hospital within 2 midnights of admission.    Claudie Rathbone D Maree DO [facility]  If 7PM-7AM, please contact night-coverage [email or web address]  03/12/2024, 2:20 PM

## 2024-03-12 NOTE — Plan of Care (Signed)

## 2024-03-12 NOTE — ED Triage Notes (Signed)
 Pt sent from PCP office for elevated creatine per pt and LLW abd pain.

## 2024-03-12 NOTE — ED Provider Notes (Signed)
 [place] EMERGENCY DEPARTMENT AT [facility] Provider Note   CSN: [id number] Arrival date & time: 03/12/24  9076     Patient presents with: Abdominal Pain   Wesley Sexton is a 53 y.o. male.   Patient is a 53 year old male who presents emergency department after being directed to come and secondary to changes in his kidney function.  He has a history of chronic kidney disease and has previously been admitted for acute on chronic kidney failure.  He notes that he has been having some difficulty with urination and feels as though he has been straining to make any urine.  He does admit to some generalized abdominal discomfort as well.  He has had no associated nausea or vomiting.  He denies any changes in bowel habits.  He has had no associated fever or chills.   Abdominal Pain      Prior to Admission medications   Medication Sig Start Date End Date Taking? Authorizing Provider  amLODipine  (NORVASC ) 5 MG tablet Take 1 tablet (5 mg total) by mouth daily. Patient not taking: Reported on 09/24/2023 07/09/22 08/08/22  Maree Bracken D, DO  hydrALAZINE  (APRESOLINE ) 100 MG tablet Take 1 tablet (100 mg total) by mouth every 8 (eight) hours. Patient not taking: Reported on 09/24/2023 07/09/22 08/08/22  Maree Bracken D, DO  tamsulosin  (FLOMAX ) 0.4 MG CAPS capsule Take 1 capsule (0.4 mg total) by mouth daily after supper. 09/26/23   Vicci Afton CROME, MD    Allergies: Latex and Shellfish allergy    Review of Systems  Gastrointestinal:  Positive for abdominal pain.  All other systems reviewed and are negative.   Updated Vital Signs BP (!) 147/97   Pulse 79   Temp 98.5 F (36.9 C) (Oral)   Ht 5' 8 (1.727 m)   Wt 68 kg   SpO2 100%   BMI 22.81 kg/m   Physical Exam Vitals and nursing note reviewed.  Constitutional:      General: He is not in acute distress.    Appearance: Normal appearance. He is not ill-appearing.  HENT:     Head: Normocephalic and atraumatic.     Nose: Nose  normal.     Mouth/Throat:     Mouth: Mucous membranes are moist.  Eyes:     Extraocular Movements: Extraocular movements intact.     Conjunctiva/sclera: Conjunctivae normal.     Pupils: Pupils are equal, round, and reactive to light.  Cardiovascular:     Rate and Rhythm: Normal rate and regular rhythm.     Pulses: Normal pulses.     Heart sounds: Normal heart sounds. No murmur heard.    No gallop.  Pulmonary:     Effort: Pulmonary effort is normal. No respiratory distress.     Breath sounds: Normal breath sounds. No stridor. No wheezing, rhonchi or rales.  Abdominal:     General: Abdomen is flat. Bowel sounds are normal. There is no distension.     Palpations: Abdomen is soft.     Tenderness: There is generalized abdominal tenderness.     Hernia: No hernia is present.  Musculoskeletal:        General: Normal range of motion.     Cervical back: Normal range of motion and neck supple.  Skin:    General: Skin is warm and dry.  Neurological:     General: No focal deficit present.     Mental Status: He is alert and oriented to person, place, and time. Mental status is at  baseline.  Psychiatric:        Mood and Affect: Mood normal.        Behavior: Behavior normal.        Thought Content: Thought content normal.        Judgment: Judgment normal.     (all labs ordered are listed, but only abnormal results are displayed) Labs Reviewed  COMPREHENSIVE METABOLIC PANEL WITH GFR - Abnormal; Notable for the following components:      Result Value   Potassium 5.5 (*)    CO2 13 (*)    BUN 51 (*)    Creatinine, Ser 7.05 (*)    Calcium 7.9 (*)    Alkaline Phosphatase 172 (*)    GFR, Estimated 9 (*)    All other components within normal limits  CBC WITH DIFFERENTIAL/PLATELET - Abnormal; Notable for the following components:   RBC 3.76 (*)    Hemoglobin 10.2 (*)    HCT 34.2 (*)    MCHC 29.8 (*)    RDW 16.3 (*)    Monocytes Absolute 1.3 (*)    All other components within normal  limits  LIPASE, BLOOD - Abnormal; Notable for the following components:   Lipase 659 (*)    All other components within normal limits  URINALYSIS, ROUTINE W REFLEX MICROSCOPIC    EKG: None  Radiology: CT Renal Stone Study Result Date: 03/12/2024 CLINICAL DATA:  Abdominal/flank pain, stone suspected Elevated creatinine.  Left lower abdominal pain. EXAM: CT ABDOMEN AND PELVIS WITHOUT CONTRAST TECHNIQUE: Multidetector CT imaging of the abdomen and pelvis was performed following the standard protocol without IV contrast. RADIATION DOSE REDUCTION: This exam was performed according to the departmental dose-optimization program which includes automated exposure control, adjustment of the mA and/or kV according to patient size and/or use of iterative reconstruction technique. COMPARISON:  Renal ultrasound 09/25/2023 and abdominopelvic CT 07/06/2022. FINDINGS: Lower chest: Clear lung bases. No significant pleural or pericardial effusion. Hepatobiliary: The liver has a non cirrhotic morphology without suspicious focal abnormality on noncontrast imaging. The gallbladder is incompletely distended. No evidence of gallstones or biliary dilatation. Pancreas: Unremarkable. No pancreatic ductal dilatation or surrounding inflammatory changes. Spleen: Normal in size without focal abnormality. Adrenals/Urinary Tract: Both adrenal glands appear normal. Severe chronic bilateral hydronephrosis and hydroureter again noted, similar to previous CT. There is progressive right renal cortical thinning. No significant perinephric soft tissue stranding. No urinary tract calculi are demonstrated. Both ureters are dilated to the bladder which is mildly distended with irregular wall thickening. Stomach/Bowel: No enteric contrast administered. Stable chronic wall thickening in the stomach. No bowel distension, wall thickening or surrounding inflammation. The appendix appears normal. Vascular/Lymphatic: Interval lobe element of mild left  periaortic adenopathy, largest node having a short axis dimension of 1.5 cm on image 31/2. No other enlarged lymph nodes identified in the abdomen or pelvis. Aortic and branch vessel atherosclerosis without evidence of aneurysm. Reproductive: Stable mild enlargement of the prostate gland without surrounding inflammation. Other: No evidence of abdominal wall mass or hernia. No ascites or pneumoperitoneum. Musculoskeletal: No acute or significant osseous findings. IMPRESSION: 1. Severe chronic bilateral hydronephrosis and hydroureter, similar to previous CT. There is progressive right renal cortical thinning. No urinary tract calculi or definite acute findings demonstrated. The hydronephrosis may be secondary to chronic bladder outlet obstruction or a neurogenic bladder. Recommend correlation with previous clinical evaluation. If clinical concern for acute superimposed infection, recommend correlation with urine analysis. 2. Mildly distended bladder with irregular wall thickening, similar to previous study. 3.  Interval development of mild left periaortic adenopathy, nonspecific. 4. Stable chronic wall thickening in the stomach. 5.  Aortic Atherosclerosis (ICD10-I70.0). Electronically Signed   By: Elsie Perone M.D.   On: 03/12/2024 12:33     Procedures   Medications Ordered in the ED  lactated ringers  bolus 1,000 mL (1,000 mLs Intravenous Bolus from Bag 03/12/24 1256)  sodium zirconium cyclosilicate  (LOKELMA ) packet 10 g (10 g Oral Given 03/12/24 1257)                                    Medical Decision Making Amount and/or Complexity of Data Reviewed Labs: ordered. Radiology: ordered.  Risk Prescription drug management. Decision regarding hospitalization.   This patient presents to the ED for concern of abdominal pain, renal failure, this involves an extensive number of treatment options, and is a complaint that carries with it a high risk of complications and morbidity.  The differential  diagnosis includes acute on chronic renal failure, electrolyte derangement, pancreatitis, acute appendicitis, cholecystitis, small bowel obstruction, diverticulitis, pyelonephritis, kidney stone   Co morbidities that complicate the patient evaluation  Chronic kidney disease, pancreatitis   Additional history obtained:  Additional history obtained from family External records from outside source obtained and reviewed including medical records   Lab Tests:  I Ordered, and personally interpreted labs.  The pertinent results include: No leukocytosis, anemia at baseline, elevated creatinine from baseline, hyperkalemia, unremarkable liver function, elevated lipase, urinalysis with large leukocytes   Imaging Studies ordered:  I ordered imaging studies including CT scan abdomen and pelvis I independently visualized and interpreted imaging which showed chronic bilateral hydronephrosis I agree with the radiologist interpretation    Consultations Obtained:  I requested consultation with the hospitalist,  and discussed lab and imaging findings as well as pertinent plan - they recommend: Admission   Problem List / ED Course / Critical interventions / Medication management  Patient is doing well at this time and does remain stable.  Discussed with patient we will plan for admission to the hospital service given his acute on chronic renal failure, elevated lipase, urinary tract infection.  CT scan of abdomen pelvis does Appear to be consistent with previous.  He does have stable vital signs with no indication for sepsis at this point.  Did discuss patient case with Dr. Maree with the hospitalist service who has excepted for admission. I ordered medication including IV fluids, Rocephin , Lokelma  for hyperkalemia, acute on chronic renal failure, urinary tract infection Reevaluation of the patient after these medicines showed that the patient improved I have reviewed the patients home medicines and  have made adjustments as needed   Social Determinants of Health:  None   Test / Admission - Considered:  Admission     Final diagnoses:  None    ED Discharge Orders     None          Daralene Lonni BIRCH, PA-C 03/12/24 1405    Elnor Jayson LABOR, DO 03/14/24 [phone]

## 2024-03-13 DIAGNOSIS — N179 Acute kidney failure, unspecified: Secondary | ICD-10-CM | POA: Diagnosis not present

## 2024-03-13 LAB — COMPREHENSIVE METABOLIC PANEL WITH GFR
ALT: 14 U/L (ref 0–44)
AST: 20 U/L (ref 15–41)
Albumin: 3.3 g/dL — ABNORMAL LOW (ref 3.5–5.0)
Alkaline Phosphatase: 149 U/L — ABNORMAL HIGH (ref 38–126)
Anion gap: 16 — ABNORMAL HIGH (ref 5–15)
BUN: 51 mg/dL — ABNORMAL HIGH (ref 6–20)
CO2: 11 mmol/L — ABNORMAL LOW (ref 22–32)
Calcium: 7.9 mg/dL — ABNORMAL LOW (ref 8.9–10.3)
Chloride: 109 mmol/L (ref 98–111)
Creatinine, Ser: 7.66 mg/dL — ABNORMAL HIGH (ref 0.61–1.24)
GFR, Estimated: 8 mL/min — ABNORMAL LOW (ref >60)
Glucose, Bld: 86 mg/dL (ref 70–99)
Potassium: 5.1 mmol/L (ref 3.5–5.1)
Sodium: 136 mmol/L (ref 135–145)
Total Bilirubin: 0.2 mg/dL (ref 0.0–1.2)
Total Protein: 6.7 g/dL (ref 6.5–8.1)

## 2024-03-13 LAB — CBC
HCT: 34.3 % — ABNORMAL LOW (ref 39.0–52.0)
Hemoglobin: 10.3 g/dL — ABNORMAL LOW (ref 13.0–17.0)
MCH: 26.9 pg (ref 26.0–34.0)
MCHC: 30 g/dL (ref 30.0–36.0)
MCV: 89.6 fL (ref 80.0–100.0)
Platelets: 295 K/uL (ref 150–400)
RBC: 3.83 MIL/uL — ABNORMAL LOW (ref 4.22–5.81)
RDW: 16.1 % — ABNORMAL HIGH (ref 11.5–15.5)
WBC: 8.3 K/uL (ref 4.0–10.5)
nRBC: 0 % (ref 0.0–0.2)

## 2024-03-13 LAB — LIPASE, BLOOD: Lipase: 99 U/L — ABNORMAL HIGH (ref 11–51)

## 2024-03-13 LAB — MAGNESIUM: Magnesium: 1.4 mg/dL — ABNORMAL LOW (ref 1.7–2.4)

## 2024-03-13 MED ORDER — SODIUM CHLORIDE 0.45 % IV SOLN
INTRAVENOUS | Status: AC
  Filled 2024-03-13: qty 75

## 2024-03-13 MED ORDER — TAMSULOSIN HCL 0.4 MG PO CAPS
0.4000 mg | ORAL_CAPSULE | Freq: Two times a day (BID) | ORAL | Status: DC
  Administered 2024-03-13 – 2024-03-14 (×3): 0.4 mg via ORAL
  Filled 2024-03-13 (×3): qty 1

## 2024-03-13 MED ORDER — SODIUM BICARBONATE 650 MG PO TABS
1300.0000 mg | ORAL_TABLET | Freq: Three times a day (TID) | ORAL | Status: DC
  Administered 2024-03-13 – 2024-03-14 (×5): 1300 mg via ORAL
  Filled 2024-03-13 (×5): qty 2

## 2024-03-13 MED ORDER — CHLORHEXIDINE GLUCONATE CLOTH 2 % EX PADS
6.0000 | MEDICATED_PAD | Freq: Every day | CUTANEOUS | Status: DC
  Administered 2024-03-13 – 2024-03-14 (×2): 6 via TOPICAL

## 2024-03-13 NOTE — Plan of Care (Signed)
   Problem: Health Behavior/Discharge Planning: Goal: Ability to manage health-related needs will improve Outcome: Progressing

## 2024-03-13 NOTE — Consult Note (Signed)
 Nephrology Consult   Requesting provider: Rendall Carwin Service requesting consult: Hospitalist Reason for consult: CKD V, hyperkalemia, acidosis   Assessment/Recommendations: Wesley Sexton is a/an 53 y.o. male with a past medical history HTN, chronic urinary obstruction, CKD 5 who present w/ acidosis and hyperkalemia   CKD 5: Hard to define definitive baseline but most likely has CKD 5 without significant AKI.  However, maybe some of the obstruction he has is reversible and creatinine will improve  He is asymptomatic at this time.  Once labs are improved slightly (mainly acidosis) I think he could discharge to follow-up closely.  He has seen nephrology in [place] in the past but wants to come see us .  I will set up a follow-up visit in 2 weeks at our [place] office. -Foley catheter per urology -Acidosis and hyperkalemia management as below -Will set up outpatient follow-up in 2 weeks - Continue to monitor closely for uremic symptoms -Continue to monitor daily Cr, Dose meds for GFR -Monitor Daily I/Os, Daily weight  -Maintain MAP>65 for optimal renal perfusion.  -Avoid nephrotoxic medications including NSAIDs -Use synthetic opioids (Fentanyl /Dilaudid ) if needed  Metabolic acidosis: Mainly nonanion gap.  Mild elevation in anion gap related to retained anions in setting of CKD.  Continue with IV bicarbonate for 5 more hours.  Start oral bicarbonate 1300 mg 3 times daily.  Foley catheter placed as well  Hyperkalemia: Fairly mild.  Related to CKD and obstruction.  Will improve as we treat acidosis as above.  Already improved this morning with Lokelma   Urinary obstruction: Mostly chronic changes.  Foley catheter per urology.  Anemia of CKD: Hemoglobin around 10.  No indication for ESA.  Continue to monitor  Hypertension: Agree with using home blood pressure medications as ordered.  Consider decreasing hydralazine  if needed   Recommendations conveyed to primary service.     [facility] 03/13/2024 8:48 AM   _____________________________________________________________________________________ CC: Abnormal labs  History of Present Illness: Wesley Sexton is a/an 53 y.o. male with a past medical history of HTN, chronic urinary obstruction, CKD 5 who presents with abnormal lab work by PCP.  The patient states he has been feeling fine.  He went to his PCP for regular care and was found to have persistently elevated creatinine as well as acidosis and hyperkalemia.  The patient states his primary care provider tried to call nephrology to get him into an appointment [facility] nephrology in [place]) but they said it would not be for several weeks so they sent him to the emergency department.  The only symptoms he has had is minimal suprapubic pain.  He feels like he has been urinating well so he has not been taking his medications like Flomax .  He denies fevers, chills, shortness of breath, chest pain, nausea, vomiting, diarrhea, hematuria.  In the emergency department found to have a potassium 5.5 and creatinine of 7.  Also with acidosis.  CT scan with bilateral hydroureteronephrosis.  Urology was contacted this morning and recommended Foley catheter placement.  We evaluated the patient for urinary obstruction in the past.  I saw him in January 2024 for obstruction and recommended he follow-up with outside nephrology.  Creatinine in March 2024 was 3.  It is unclear what his trajectory has been since then.  However he was hospitalized in June and creatinine was around 6.1 at that time.   Medications:  Current Facility-Administered Medications  Medication Dose Route Frequency Provider Last Rate Last Admin   acetaminophen  (TYLENOL ) tablet 650 mg  650 mg Oral Q6H PRN Shah, Pratik D, DO   650 mg at 03/12/24 1711   Or   acetaminophen  (TYLENOL ) suppository 650 mg  650 mg Rectal Q6H PRN Maree, Pratik D, DO       amLODipine  (NORVASC ) tablet 5  mg  5 mg Oral Daily Maree, Pratik D, DO   5 mg at 03/12/24 1537   cefTRIAXone  (ROCEPHIN ) 1 g in sodium chloride  0.9 % 100 mL IVPB  1 g Intravenous Q24H Maree, Pratik D, DO       Chlorhexidine  Gluconate Cloth 2 % PADS 6 each  6 each Topical Daily Emokpae, Courage, MD       heparin  injection 5,000 Units  5,000 Units Subcutaneous Q8H Shah, Pratik D, DO   5,000 Units at 03/13/24 0551   hydrALAZINE  (APRESOLINE ) injection 10 mg  10 mg Intravenous Q4H PRN Maree, Pratik D, DO       hydrALAZINE  (APRESOLINE ) tablet 100 mg  100 mg Oral Q8H Shah, Pratik D, DO   100 mg at 03/13/24 9493   labetalol  (NORMODYNE ) injection 10 mg  10 mg Intravenous Q2H PRN Maree, Pratik D, DO       ondansetron  (ZOFRAN ) tablet 4 mg  4 mg Oral Q6H PRN Maree, Pratik D, DO       Or   ondansetron  (ZOFRAN ) injection 4 mg  4 mg Intravenous Q6H PRN Maree, Pratik D, DO       sodium bicarbonate  75 mEq in sodium chloride  0.45 % 1,075 mL infusion   Intravenous Continuous Macel Jayson PARAS, MD       sodium bicarbonate  tablet 1,300 mg  1,300 mg Oral TID Macel Jayson PARAS, MD       tamsulosin  (FLOMAX ) capsule 0.4 mg  0.4 mg Oral BID Pearlean Manus, MD         ALLERGIES Latex and Shellfish allergy  MEDICAL HISTORY Past Medical History:  Diagnosis Date   Hypertension    Pancreatitis      SOCIAL HISTORY Social History   Socioeconomic History   Marital status: Divorced    Spouse name: Not on file   Number of children: Not on file   Years of education: Not on file   Highest education level: Not on file  Occupational History   Not on file  Tobacco Use   Smoking status: Every Day    Current packs/day: 0.50    Types: Cigarettes   Smokeless tobacco: Never  Vaping Use   Vaping status: Never Used  Substance and Sexual Activity   Alcohol use: Yes    Comment: Drank beer last night   Drug use: Yes    Types: Cocaine, Marijuana    Comment: used 4/5 days ago   Sexual activity: Not on file  Other Topics Concern   Not on file  Social  History Narrative   Not on file   Social Drivers of Health   Financial Resource Strain: Not on file  Food Insecurity: No Food Insecurity (03/12/2024)   Hunger Vital Sign    Worried About Running Out of Food in the Last Year: Never true    Ran Out of Food in the Last Year: Never true  Transportation Needs: No Transportation Needs (03/12/2024)   PRAPARE - Administrator, Civil Service (Medical): No    Lack of Transportation (Non-Medical): No  Physical Activity: Not on file  Stress: Not on file  Social Connections: Unknown (03/12/2024)   Social Connection and Isolation Panel    Frequency of  Communication with Friends and Family: Not on file    Frequency of Social Gatherings with Friends and Family: Never    Attends Religious Services: Never    Database Administrator or Organizations: Yes    Attends Banker Meetings: Never    Marital Status: Patient declined  Intimate Partner Violence: Not At Risk (03/12/2024)   Humiliation, Afraid, Rape, and Kick questionnaire    Fear of Current or Ex-Partner: No    Emotionally Abused: No    Physically Abused: No    Sexually Abused: No     FAMILY HISTORY History reviewed. No pertinent family history.    Review of Systems: 12 systems reviewed Otherwise as per HPI, all other systems reviewed and negative  Physical Exam: Vitals:   03/13/24 0030 03/13/24 0349  BP: 125/71 139/87  Pulse: (!) 110 (!) 110  Resp: 18 20  Temp: 99 F (37.2 C) 98.7 F (37.1 C)  SpO2: 98% 99%   No intake/output data recorded.  Intake/Output Summary (Last 24 hours) at 03/13/2024 0848 Last data filed at 03/12/2024 2201 Gross per 24 hour  Intake 240 ml  Output --  Net 240 ml   General: well-appearing, no acute distress HEENT: anicteric sclera, oropharynx clear without lesions CV: Normal rate, no rub, no lower extremity edema Lungs: clear to auscultation bilaterally, normal work of breathing Abd: soft, non-tender,  non-distended Skin: no visible lesions or rashes Psych: alert, engaged, appropriate mood and affect Musculoskeletal: no obvious deformities Neuro: normal speech, no gross focal deficits   Test Results Reviewed Lab Results  Component Value Date   NA 136 03/13/2024   K 5.1 03/13/2024   CL 109 03/13/2024   CO2 11 (L) 03/13/2024   BUN 51 (H) 03/13/2024   CREATININE 7.66 (H) 03/13/2024   CALCIUM 7.9 (L) 03/13/2024   ALBUMIN 3.3 (L) 03/13/2024   PHOS 5.8 (H) 09/26/2023    CBC Recent Labs  Lab 03/12/24 1051 03/13/24 0443  WBC 9.8 8.3  NEUTROABS 7.4  --   HGB 10.2* 10.3*  HCT 34.2* 34.3*  MCV 91.0 89.6  PLT 327 295    I have reviewed all relevant outside healthcare records related to the patient's current hospitalization

## 2024-03-13 NOTE — Progress Notes (Signed)
 PROGRESS NOTE   Wesley Sexton, is a 53 y.o. male, DOB - 1970-09-06, FMW:[id number]  Admit date - 03/12/2024   Admitting Physician Pratik JONETTA Fairly, DO  Outpatient Primary MD for the patient is Katrinka Aquas, MD  LOS - 0  Chief Complaint  Patient presents with   Abdominal Pain        Brief Narrative:  53 y.o. male with medical history significant for hypertension, chronic bilateral hydroureteronephrosis, CKD stage V, and tobacco abuse who was apparently being seen by his PCP yesterday for regular checkup and had lab work drawn.  He was called today regarding abnormal lab work to include elevated creatinine levels and was told to come to the ED for evaluation.  He complains of minimal suprapubic abdominal pain, but no other dysuria or hematuria. Patient states that he has been voiding fine despite the fact that he ran out of his tamsulosin  a few days ago.  He states that he has not been following up with a nephrologist regarding his kidney function.  He apparently stated to EDP that he was nauseous and vomiting, however denies this at this time.  He denies any other fevers, chills, chest pain, or shortness of breath.  He continues to smoke 12 cigarettes/day and denies any alcohol use.  He states he has been compliant with his home blood pressure medications.   ED Course: Vital signs stable and patient is afebrile and on room air.  Hemoglobin 10.2, potassium 5.5, creatinine 7.05 and noted to be 6 back on 09/2023.  Lipase of 650 with alkaline phosphatase elevation noted as well.  CT scan with bilateral hydroureteronephrosis and no other acute findings noted.  Urinalysis concerning for UTI and patient started on Rocephin  and was also given 1 L of IV fluid as well as Lokelma .  EKG with no concerning findings.    -Assessment and Plan: AKI on CKD stage IV/V with hyperkalemia and metabolic acidosis -Received IV fluids and bicarb drip - Discussed with nephrologist who is adding oral bicarb -Creatinine  continues to trend up -Most likely patient will need to be clipped for hemodialysis in the near future -Potassium improved after Lokelma  and bicarb -renally adjust medications, avoid nephrotoxic agents / dehydration  / hypotension  Chronic bilateral hydro ureteral nephrosis - Has seen urology in the past and denies any difficulty with urination - Bladder scan with only 160 cc of urine - Ran out of tamsulosin  4 days ago - Discussed with urologist Dr. Sherrilee recommends Foley catheter and outpatient urology follow-up   Lipase elevation/alkaline phosphatase elevation - Denies any nausea or vomiting - Has mild suprapubic abdominal pain - Continue gentle IV fluid and maintain on renal diet for now - Antiemetics as needed    UTI - UA on admission was suggestive of UTI patient was started on Rocephin  empirically on admission apparently for presumed UTI -Urine culture was not sent, -Okay to complete 3 days of IV Rocephin    Hypertension - Continue amlodipine  and hydralazine  -may use IV labetalol  as needed elevated BP    Tobacco abuse - Smokes 12 cigarettes/day - Counseled on cessation  Mild chronic anemia--- no bleeding concerns, suspect anemia of CKD - Defer decision on Procrit/ESA agent to nephrology team  Status is: Inpatient   Disposition: The patient is from: Home              Anticipated d/c is to: Home              Anticipated d/c date is: 1 day  Patient currently is not medically stable to d/c. Barriers: Not Clinically Stable-   Code Status :  -  Code Status: Full Code   Family Communication:    NA (patient is alert, awake and coherent)   DVT Prophylaxis  :   - SCDs   heparin  injection 5,000 Units Start: 03/12/24 1800   Lab Results  Component Value Date   PLT 295 03/13/2024    Inpatient Medications  Scheduled Meds:  amLODipine   5 mg Oral Daily   heparin   5,000 Units Subcutaneous Q8H   hydrALAZINE   100 mg Oral Q8H   tamsulosin   0.4 mg Oral QPC  supper   Continuous Infusions:  cefTRIAXone  (ROCEPHIN )  IV     PRN Meds:.acetaminophen  **OR** acetaminophen , hydrALAZINE , labetalol , ondansetron  **OR** ondansetron  (ZOFRAN ) IV   Anti-infectives (From admission, onward)    Start     Dose/Rate Route Frequency Ordered Stop   03/13/24 1000  cefTRIAXone  (ROCEPHIN ) 1 g in sodium chloride  0.9 % 100 mL IVPB        1 g 200 mL/hr over 30 Minutes Intravenous Every 24 hours 03/12/24 1425 03/15/24 0959   03/12/24 1345  cefTRIAXone  (ROCEPHIN ) 1 g in sodium chloride  0.9 % 100 mL IVPB        1 g 200 mL/hr over 30 Minutes Intravenous  Once 03/12/24 1338 03/12/24 1611         Subjective: Wesley Sexton today has no fevers, no emesis,  No chest pain,   - Tolerated Foley placement well--urine output through Foley with clear urine   Objective: Vitals:   03/12/24 1450 03/12/24 2041 03/13/24 0030 03/13/24 0349  BP: (!) 158/116 (!) 139/96 125/71 139/87  Pulse: 89 (!) 109 (!) 110 (!) 110  Resp:  20 18 20   Temp:  99 F (37.2 C) 99 F (37.2 C) 98.7 F (37.1 C)  TempSrc:  Oral Oral Oral  SpO2: 100% 99% 98% 99%  Weight:      Height:        Intake/Output Summary (Last 24 hours) at 03/13/2024 0809 Last data filed at 03/12/2024 2201 Gross per 24 hour  Intake 240 ml  Output --  Net 240 ml   Filed Weights   03/12/24 1025  Weight: 68 kg    Physical Exam  Gen:- Awake Alert,  in no apparent distress  HEENT:- [place].AT, No sclera icterus Neck-Supple Neck,No JVD,.  Lungs-  CTAB , fair symmetrical air movement CV- S1, S2 normal, regular  Abd-  +ve B.Sounds, Abd Soft, No tenderness, no CVA area tenderness    Extremity/Skin:- No  edema, pedal pulses present  Psych-affect is appropriate, oriented x3 Neuro-no new focal deficits, no tremors  Data Reviewed: I have personally reviewed following labs and imaging studies  CBC: Recent Labs  Lab 03/12/24 1051 03/13/24 0443  WBC 9.8 8.3  NEUTROABS 7.4  --   HGB 10.2* 10.3*  HCT 34.2* 34.3*  MCV  91.0 89.6  PLT 327 295   Basic Metabolic Panel: Recent Labs  Lab 03/12/24 1051 03/12/24 1508 03/13/24 0443  NA 136  --  136  K 5.5* 5.3* 5.1  CL 111  --  109  CO2 13*  --  11*  GLUCOSE 96  --  86  BUN 51*  --  51*  CREATININE 7.05*  --  7.66*  CALCIUM 7.9*  --  7.9*  MG  --   --  1.4*   GFR: Estimated Creatinine Clearance: 10.9 mL/min (A) (by C-G formula based on SCr of  7.66 mg/dL (H)). Liver Function Tests: Recent Labs  Lab 03/12/24 1051 03/13/24 0443  AST 23 20  ALT 17 14  ALKPHOS 172* 149*  BILITOT 0.3 0.2  PROT 7.1 6.7  ALBUMIN 4.0 3.3*   Radiology Studies: CT Renal Stone Study Result Date: 03/12/2024 CLINICAL DATA:  Abdominal/flank pain, stone suspected Elevated creatinine.  Left lower abdominal pain. EXAM: CT ABDOMEN AND PELVIS WITHOUT CONTRAST TECHNIQUE: Multidetector CT imaging of the abdomen and pelvis was performed following the standard protocol without IV contrast. RADIATION DOSE REDUCTION: This exam was performed according to the departmental dose-optimization program which includes automated exposure control, adjustment of the mA and/or kV according to patient size and/or use of iterative reconstruction technique. COMPARISON:  Renal ultrasound 09/25/2023 and abdominopelvic CT 07/06/2022. FINDINGS: Lower chest: Clear lung bases. No significant pleural or pericardial effusion. Hepatobiliary: The liver has a non cirrhotic morphology without suspicious focal abnormality on noncontrast imaging. The gallbladder is incompletely distended. No evidence of gallstones or biliary dilatation. Pancreas: Unremarkable. No pancreatic ductal dilatation or surrounding inflammatory changes. Spleen: Normal in size without focal abnormality. Adrenals/Urinary Tract: Both adrenal glands appear normal. Severe chronic bilateral hydronephrosis and hydroureter again noted, similar to previous CT. There is progressive right renal cortical thinning. No significant perinephric soft tissue  stranding. No urinary tract calculi are demonstrated. Both ureters are dilated to the bladder which is mildly distended with irregular wall thickening. Stomach/Bowel: No enteric contrast administered. Stable chronic wall thickening in the stomach. No bowel distension, wall thickening or surrounding inflammation. The appendix appears normal. Vascular/Lymphatic: Interval lobe element of mild left periaortic adenopathy, largest node having a short axis dimension of 1.5 cm on image 31/2. No other enlarged lymph nodes identified in the abdomen or pelvis. Aortic and branch vessel atherosclerosis without evidence of aneurysm. Reproductive: Stable mild enlargement of the prostate gland without surrounding inflammation. Other: No evidence of abdominal wall mass or hernia. No ascites or pneumoperitoneum. Musculoskeletal: No acute or significant osseous findings. IMPRESSION: 1. Severe chronic bilateral hydronephrosis and hydroureter, similar to previous CT. There is progressive right renal cortical thinning. No urinary tract calculi or definite acute findings demonstrated. The hydronephrosis may be secondary to chronic bladder outlet obstruction or a neurogenic bladder. Recommend correlation with previous clinical evaluation. If clinical concern for acute superimposed infection, recommend correlation with urine analysis. 2. Mildly distended bladder with irregular wall thickening, similar to previous study. 3. Interval development of mild left periaortic adenopathy, nonspecific. 4. Stable chronic wall thickening in the stomach. 5.  Aortic Atherosclerosis (ICD10-I70.0). Electronically Signed   By: Elsie Perone M.D.   On: 03/12/2024 12:33   Scheduled Meds:  amLODipine   5 mg Oral Daily   heparin   5,000 Units Subcutaneous Q8H   hydrALAZINE   100 mg Oral Q8H   tamsulosin   0.4 mg Oral QPC supper   Continuous Infusions:  cefTRIAXone  (ROCEPHIN )  IV      LOS: 0 days   Wesley Sexton M.D on 03/13/2024 at 8:09 AM  Go to  [email or web address] - for contact info  [facility] - Office  [phone]  If 7PM-7AM, please contact night-coverage [email or web address] 03/13/2024, 8:09 AM

## 2024-03-13 NOTE — TOC CM/SW Note (Signed)
 Transition of Care [facility]) - Inpatient Brief Assessment   Patient Details  Name: Wesley Sexton MRN: [id number] Date of Birth: 07/14/1970  [facility] CM/SW Contact:    Lucie Lunger, LCSWA Phone Number: 03/13/2024, 9:23 AM   Clinical Narrative: Transition of Care Department [facility]) has reviewed patient and no TOC needs have been identified at this time. We will continue to monitor patient advancement through interdiciplinary progression rounds. If new patient transition needs arise, please place a TOC consult.  Transition of Care Asessment: Insurance and Status: Insurance coverage has been reviewed Patient has primary care physician: Yes Home environment has been reviewed: From home Prior level of function:: Independent Prior/Current Home Services: No current home services Social Drivers of Health Review: SDOH reviewed no interventions necessary Readmission risk has been reviewed: Yes Transition of care needs: no transition of care needs at this time

## 2024-03-13 NOTE — Progress Notes (Signed)
No acute events overnight. Kellogg RN

## 2024-03-13 NOTE — Plan of Care (Signed)

## 2024-03-14 LAB — RENAL FUNCTION PANEL
Albumin: 3.4 g/dL — ABNORMAL LOW (ref 3.5–5.0)
Anion gap: 13 (ref 5–15)
BUN: 51 mg/dL — ABNORMAL HIGH (ref 6–20)
CO2: 15 mmol/L — ABNORMAL LOW (ref 22–32)
Calcium: 7.9 mg/dL — ABNORMAL LOW (ref 8.9–10.3)
Chloride: 109 mmol/L (ref 98–111)
Creatinine, Ser: 7.36 mg/dL — ABNORMAL HIGH (ref 0.61–1.24)
GFR, Estimated: 8 mL/min — ABNORMAL LOW (ref >60)
Glucose, Bld: 99 mg/dL (ref 70–99)
Phosphorus: 4.7 mg/dL — ABNORMAL HIGH (ref 2.5–4.6)
Potassium: 4.5 mmol/L (ref 3.5–5.1)
Sodium: 137 mmol/L (ref 135–145)

## 2024-03-14 MED ORDER — PNEUMOCOCCAL 20-VAL CONJ VACC 0.5 ML IM SUSY: 0.5000 mL | PREFILLED_SYRINGE | INTRAMUSCULAR | Status: DC

## 2024-03-14 MED ORDER — SODIUM BICARBONATE 650 MG PO TABS: 1300.0000 mg | ORAL_TABLET | Freq: Three times a day (TID) | ORAL | 3 refills | Status: AC

## 2024-03-14 MED ORDER — TAMSULOSIN HCL 0.4 MG PO CAPS: 0.4000 mg | ORAL_CAPSULE | Freq: Every day | ORAL | 0 refills | Status: AC

## 2024-03-14 NOTE — Discharge Summary (Signed)
 Wesley Sexton, is a 53 y.o. male  DOB Oct 22, 1970  MRN [id number].  Admission date:  03/12/2024  Admitting Physician  Pratik JONETTA Fairly, DO  Discharge Date:  03/14/2024   Primary MD  Katrinka Aquas, MD  Recommendations for primary care physician for things to follow:  1)Avoid ibuprofen /Advil /Aleve /Motrin Wesley Sexton/Naproxen /BC Sexton/Meloxicam/Diclofenac/Indomethacin and other Nonsteroidal anti-inflammatory medications as these will make you more likely to bleed and can cause stomach ulcers, can also cause Kidney problems.   2)Follow up with Nephrologist Dr. Curtis Heman or Dr Fairy Sellar--- in about 1 week or so at [facility], [address], [place], [place] [address], Phone [phone]  3)Repeat CBC and BMP blood test in about 1 week or so  4)Please follow-up with Urologist Dr. Sherrilee in about --- in 1 week or so for possible Foley catheter removal for recheck and follow-up evaluation  in his office----[facility], [address], [place] [address] Phone Number----[phone]  5) leave Foley catheter in place until seen by urology Dr. Sherrilee in about a week or so  Admission Diagnosis  Hyperkalemia [E87.5] AKI (acute kidney injury) [N17.9] Elevated lipase [R74.8] Urinary tract infection without hematuria, site unspecified [N39.0] Acute renal failure superimposed on chronic kidney disease, unspecified acute renal failure type, unspecified CKD stage [N17.9, N18.9]   Discharge Diagnosis  Hyperkalemia [E87.5] AKI (acute kidney injury) [N17.9] Elevated lipase [R74.8] Urinary tract infection without hematuria, site unspecified [N39.0] Acute renal failure superimposed on chronic kidney disease, unspecified acute renal failure type, unspecified CKD stage [N17.9, N18.9]    Principal Problem:   AKI (acute kidney injury) Active Problems:   CKD  (chronic kidney disease) stage 4, GFR 15-29 ml/min (HCC)   Essential hypertension   Bilateral hydronephrosis   Hyperkalemia   Benign prostatic hyperplasia with urinary obstruction      Past Medical History:  Diagnosis Date   Hypertension    Pancreatitis     Past Surgical History:  Procedure Laterality Date   pin hole in heart     LEG SURGERY       HPI  from the history and physical done on the day of admission:   Patient coming from: Home   Chief Complaint: Abnormal lab work at PCP office 11/24   HPI: Wesley Sexton is a 53 y.o. male with medical history significant for hypertension, chronic bilateral hydroureteronephrosis, CKD stage V, and tobacco abuse who was apparently being seen by his PCP yesterday for regular checkup and had lab work drawn.  He was called today regarding abnormal lab work to include elevated creatinine levels and was told to come to the ED for evaluation.  He complains of minimal suprapubic abdominal pain, but no other dysuria or hematuria. Patient states that he has been voiding fine despite the fact that he ran out of his tamsulosin  a few days ago.  He states that he has not been following up with a nephrologist regarding his kidney function.  He apparently stated to EDP that he was nauseous and vomiting,  however denies this at this time.  He denies any other fevers, chills, chest pain, or shortness of breath.  He continues to smoke 12 cigarettes/day and denies any alcohol use.  He states he has been compliant with his home blood pressure medications.   ED Course: Vital signs stable and patient is afebrile and on room air.  Hemoglobin 10.2, potassium 5.5, creatinine 7.05 and noted to be 6 back on 09/2023.  Lipase of 650 with alkaline phosphatase elevation noted as well.  CT scan with bilateral hydroureteronephrosis and no other acute findings noted.  Urinalysis concerning for UTI and patient started on Rocephin  and was also given 1 L of IV fluid as well as Lokelma .   EKG with no concerning findings.   Review of Systems: Reviewed as noted above, otherwise negative.     Hospital Course:   Assessment and Plan: 1)AKI on CKD stage IV/V with hyperkalemia and metabolic acidosis -Renal Function Improved with IV fluids and bicarb drip - Discussed with nephrologist Dc home on bicarb supplements -Follow-up with nephrologist as outpatient in 1 to 2 weeks to discuss possible clipping for initiation of hemodialysis in the near future -Potassium improved after Lokelma  and bicarb -renally adjust medications, avoid nephrotoxic agents / dehydration  / hypotension   2)Chronic bilateral hydro ureteral nephrosis -- Ran out of tamsulosin  4 days ago - Discussed with urologist Dr. Sherrilee recommends Foley catheter and outpatient urology follow-up --Refills for tamsulosin  given   3)Lipase elevation/alkaline phosphatase elevation - Denies any nausea or vomiting- -mostly asymptomatic at this time    UTI - UA on admission was suggestive of UTI patient was started on Rocephin  empirically on admission apparently for presumed UTI -Urine culture was not sent, - completed 3 days of IV Rocephin    Hypertension Stable off BP meds at this time --Nephrologist and PCP to decide to reinitiate amlodipine  in the near future if BP starts to trend back up    Tobacco abuse - Smoking cessation advised may use nicotine patch   Mild chronic anemia--- no bleeding concerns, suspect anemia of CKD -Hgb stable above 10 - Defer decision on Procrit/ESA agent to nephrology team  Discharge Condition: stable  Follow UP   Follow-up Information     Prescilla Beams, MD. Schedule an appointment as soon as possible for a visit in 1 week(s).   Specialty: Nephrology Contact information: [facility] [phone]         Sherrilee Belvie CROME, MD. Schedule an appointment as soon as possible for a visit in 1 week(s).   Specialty: Urology Contact information: [address]  [address] [place] [address] [phone]                 Consults obtained -nephrology consult appreciated, also discussed with neurology team  Diet and Activity recommendation:  As advised  Discharge Instructions    Discharge Instructions     Call MD for:  difficulty breathing, headache or visual disturbances   Complete by: As directed    Call MD for:  persistant dizziness or light-headedness   Complete by: As directed    Call MD for:  persistant nausea and vomiting   Complete by: As directed    Call MD for:  temperature >100.4   Complete by: As directed    Diet - low sodium heart healthy   Complete by: As directed    Discharge instructions   Complete by: As directed    1)Avoid ibuprofen /Advil /Aleve /Motrin Wesley Sexton/Naproxen /BC Sexton/Meloxicam/Diclofenac/Indomethacin and other Nonsteroidal anti-inflammatory medications  as these will make you more likely to bleed and can cause stomach ulcers, can also cause Kidney problems.   2)Follow up with Nephrologist Dr. Curtis Heman or Dr Fairy Sellar--- in about 1 week or so at [facility], [address] [place], [place] [address], Phone [phone]  3)Repeat CBC and BMP blood test in about 1 week or so  4)Please follow-up with Urologist Dr. Sherrilee in about --- in 1 week or so for possible Foley catheter removal for recheck and follow-up evaluation  in his office----[facility], [address], [place] [address] Phone Number----[phone]  5) leave Foley catheter in place until seen by urology Dr. Sherrilee in about a week or so   Increase activity slowly   Complete by: As directed         Discharge Medications     Allergies as of 03/14/2024       Reactions   Latex Hives   Shellfish Allergy Hives        Medication List     STOP taking these medications    hydrALAZINE  100 MG tablet Commonly known as: APRESOLINE     oxybutynin  5 MG tablet Commonly known as: DITROPAN        TAKE these medications    Lokelma  10 g Pack packet Generic drug: sodium zirconium cyclosilicate  Take 10 g by mouth daily.   sodium bicarbonate  650 MG tablet Take 2 tablets (1,300 mg total) by mouth 3 (three) times daily. What changed: how much to take   tamsulosin  0.4 MG Caps capsule Commonly known as: FLOMAX  Take 1 capsule (0.4 mg total) by mouth daily after supper.       Major procedures and Radiology Reports - PLEASE review detailed and final reports for all details, in brief -   CT Renal Stone Study Result Date: 03/12/2024 CLINICAL DATA:  Abdominal/flank pain, stone suspected Elevated creatinine.  Left lower abdominal pain. EXAM: CT ABDOMEN AND PELVIS WITHOUT CONTRAST TECHNIQUE: Multidetector CT imaging of the abdomen and pelvis was performed following the standard protocol without IV contrast. RADIATION DOSE REDUCTION: This exam was performed according to the departmental dose-optimization program which includes automated exposure control, adjustment of the mA and/or kV according to patient size and/or use of iterative reconstruction technique. COMPARISON:  Renal ultrasound 09/25/2023 and abdominopelvic CT 07/06/2022. FINDINGS: Lower chest: Clear lung bases. No significant pleural or pericardial effusion. Hepatobiliary: The liver has a non cirrhotic morphology without suspicious focal abnormality on noncontrast imaging. The gallbladder is incompletely distended. No evidence of gallstones or biliary dilatation. Pancreas: Unremarkable. No pancreatic ductal dilatation or surrounding inflammatory changes. Spleen: Normal in size without focal abnormality. Adrenals/Urinary Tract: Both adrenal glands appear normal. Severe chronic bilateral hydronephrosis and hydroureter again noted, similar to previous CT. There is progressive right renal cortical thinning. No significant perinephric soft tissue stranding. No urinary tract calculi  are demonstrated. Both ureters are dilated to the bladder which is mildly distended with irregular wall thickening. Stomach/Bowel: No enteric contrast administered. Stable chronic wall thickening in the stomach. No bowel distension, wall thickening or surrounding inflammation. The appendix appears normal. Vascular/Lymphatic: Interval lobe element of mild left periaortic adenopathy, largest node having a short axis dimension of 1.5 cm on image 31/2. No other enlarged lymph nodes identified in the abdomen or pelvis. Aortic and branch vessel atherosclerosis without evidence of aneurysm. Reproductive: Stable mild enlargement of the prostate gland without surrounding inflammation. Other: No evidence of abdominal wall mass or hernia. No ascites or  pneumoperitoneum. Musculoskeletal: No acute or significant osseous findings. IMPRESSION: 1. Severe chronic bilateral hydronephrosis and hydroureter, similar to previous CT. There is progressive right renal cortical thinning. No urinary tract calculi or definite acute findings demonstrated. The hydronephrosis may be secondary to chronic bladder outlet obstruction or a neurogenic bladder. Recommend correlation with previous clinical evaluation. If clinical concern for acute superimposed infection, recommend correlation with urine analysis. 2. Mildly distended bladder with irregular wall thickening, similar to previous study. 3. Interval development of mild left periaortic adenopathy, nonspecific. 4. Stable chronic wall thickening in the stomach. 5.  Aortic Atherosclerosis (ICD10-I70.0). Electronically Signed   By: Elsie Perone M.D.   On: 03/12/2024 12:33   Today   Subjective    Gaither Colt today has no new complaints  - Foley catheter with good output No fever  Or chills   No Nausea, Vomiting or Diarrhea      Patient has been seen and examined prior to discharge   Objective   Blood pressure (!) 97/59, pulse 79, temperature 98.2 F (36.8 C), resp. rate 16,  height 5' 8 (1.727 m), weight 68 kg, SpO2 97%.   Intake/Output Summary (Last 24 hours) at 03/14/2024 1428 Last data filed at 03/14/2024 0900 Gross per 24 hour  Intake 640 ml  Output 3350 ml  Net -2710 ml    Exam Gen:- Awake Alert, no acute distress  HEENT:- [place].AT, No sclera icterus Neck-Supple Neck,No JVD,.  Lungs-  CTAB , good air movement bilaterally CV- S1, S2 normal, regular Abd-  +ve B.Sounds, Abd Soft, No tenderness, No CVA tenderness  Extremity/Skin:- No  edema,   good pulses Psych-affect is appropriate, oriented x3 Neuro-no new focal deficits, no tremors  GU--Foley in situ   Data Review   CBC w Diff:  Lab Results  Component Value Date   WBC 8.3 03/13/2024   HGB 10.3 (L) 03/13/2024   HCT 34.3 (L) 03/13/2024   PLT 295 03/13/2024   LYMPHOPCT 9 03/12/2024   MONOPCT 13 03/12/2024   EOSPCT 1 03/12/2024   BASOPCT 0 03/12/2024    CMP:  Lab Results  Component Value Date   NA 137 03/14/2024   NA 140 04/30/2014   K 4.5 03/14/2024   K 3.8 04/30/2014   CL 109 03/14/2024   CL 106 04/30/2014   CO2 15 (L) 03/14/2024   CO2 27 04/30/2014   BUN 51 (H) 03/14/2024   BUN 12 04/30/2014   CREATININE 7.36 (H) 03/14/2024   CREATININE 0.95 04/30/2014   PROT 6.7 03/13/2024   ALBUMIN 3.4 (L) 03/14/2024   BILITOT 0.2 03/13/2024   ALKPHOS 149 (H) 03/13/2024   AST 20 03/13/2024   ALT 14 03/13/2024  .  Total Discharge time is about 33 minutes  Rendall Carwin M.D on 03/14/2024 at 2:28 PM  Go to [email or web address] -  for contact info  [facility] - Office  [phone]

## 2024-03-14 NOTE — Progress Notes (Signed)
   03/13/24 1910  Assess: MEWS Score  Temp 98 F (36.7 C)  BP 113/68  MAP (mmHg) 79  Pulse Rate (!) 117  SpO2 100 %  O2 Device Room Air  Assess: MEWS Score  MEWS Temp 0  MEWS Systolic 0  MEWS Pulse 2  MEWS RR 0  MEWS LOC 0  MEWS Score 2  MEWS Score Color Yellow  Assess: if the MEWS score is Yellow or Red  Were vital signs accurate and taken at a resting state? Yes  Does the patient meet 2 or more of the SIRS criteria? No  MEWS guidelines implemented  Yes, yellow  Treat  MEWS Interventions Considered administering scheduled or prn medications/treatments as ordered  Take Vital Signs  Increase Vital Sign Frequency  Yellow: Q2hr x1, continue Q4hrs until patient remains green for 12hrs  Escalate  MEWS: Escalate Yellow: Discuss with charge nurse and consider notifying provider and/or RRT  Notify: Charge Nurse/RN  Name of Charge Nurse/RN Notified Geneticist, Molecular  Assess: SIRS CRITERIA  SIRS Temperature  0  SIRS Respirations  0  SIRS Pulse 1  SIRS WBC 0  SIRS Score Sum  1

## 2024-03-14 NOTE — Plan of Care (Signed)

## 2024-03-14 NOTE — Discharge Instructions (Signed)
 1)Avoid ibuprofen /Advil /Aleve /Motrin Josefine Powders/Naproxen /BC powders/Meloxicam/Diclofenac/Indomethacin and other Nonsteroidal anti-inflammatory medications as these will make you more likely to bleed and can cause stomach ulcers, can also cause Kidney problems.   2)Follow up with Nephrologist Dr. Curtis Heman or Dr Fairy Sellar--- in about 1 week or so at [facility], [address], [place], [place] [address], Phone -[phone]  3)Repeat CBC and BMP blood test in about 1 week or so  4)Please follow-up with Urologist Dr. Sherrilee in about --- in 1 week or so for possible Foley catheter removal for recheck and follow-up evaluation  in his office----[facility], [address], [place] [address] Phone Number----[phone]  5) leave Foley catheter in place until seen by urology Dr. Sherrilee in about a week or so

## 2024-03-14 NOTE — Progress Notes (Signed)
 Discharge instructions reviewed, pt states understanding. Reviewed foley catheter care & cleaning, pt gives good verbal return. Standard drainage bag changed to leg bag for discharge. Pt currently awaiting ride to arrive. Denies any c/o.

## 2024-03-14 NOTE — Plan of Care (Signed)
   Problem: Education: Goal: Knowledge of General Education information will improve Description Including pain rating scale, medication(s)/side effects and non-pharmacologic comfort measures Outcome: Progressing   Problem: Health Behavior/Discharge Planning: Goal: Ability to manage health-related needs will improve Outcome: Progressing
